# Patient Record
Sex: Female | Born: 1965
Health system: Southern US, Community
[De-identification: ages and names within clinical notes are randomized; demographics above are authoritative.]

## PROBLEM LIST (undated history)

## (undated) DIAGNOSIS — K529 Noninfective gastroenteritis and colitis, unspecified: Secondary | ICD-10-CM

## (undated) DIAGNOSIS — Z8659 Personal history of other mental and behavioral disorders: Secondary | ICD-10-CM

## (undated) DIAGNOSIS — Z1509 Genetic susceptibility to other malignant neoplasm: Secondary | ICD-10-CM

## (undated) DIAGNOSIS — R002 Palpitations: Secondary | ICD-10-CM

## (undated) DIAGNOSIS — Z1507 Genetic susceptibility to malignant neoplasm of urinary tract: Secondary | ICD-10-CM

## (undated) HISTORY — PX: AUGMENTATION MAMMAPLASTY: SUR837

## (undated) HISTORY — DX: Palpitations: R00.2

## (undated) HISTORY — DX: Personal history of other mental and behavioral disorders: Z86.59

## (undated) HISTORY — DX: Genetic susceptibility to other malignant neoplasm: Z15.09

## (undated) HISTORY — DX: Genetic susceptibility to malignant neoplasm of urinary tract: Z15.07

## (undated) HISTORY — DX: Noninfective gastroenteritis and colitis, unspecified: K52.9

## (undated) HISTORY — PX: ANAL SPHINCTER PROSTHESIS PLACEMENT: SUR1036

---

## 1998-06-10 ENCOUNTER — Other Ambulatory Visit: Admission: RE | Admit: 1998-06-10 | Discharge: 1998-06-10 | Payer: Self-pay | Admitting: Obstetrics and Gynecology

## 2004-04-20 ENCOUNTER — Emergency Department (HOSPITAL_COMMUNITY): Admission: EM | Admit: 2004-04-20 | Discharge: 2004-04-20 | Payer: Self-pay | Admitting: Emergency Medicine

## 2004-10-26 ENCOUNTER — Emergency Department (HOSPITAL_COMMUNITY): Admission: EM | Admit: 2004-10-26 | Discharge: 2004-10-26 | Payer: Self-pay | Admitting: Emergency Medicine

## 2005-10-03 ENCOUNTER — Ambulatory Visit: Payer: Self-pay | Admitting: Gastroenterology

## 2006-04-07 ENCOUNTER — Emergency Department (HOSPITAL_COMMUNITY): Admission: EM | Admit: 2006-04-07 | Discharge: 2006-04-07 | Payer: Self-pay | Admitting: Emergency Medicine

## 2006-04-09 ENCOUNTER — Emergency Department (HOSPITAL_COMMUNITY): Admission: EM | Admit: 2006-04-09 | Discharge: 2006-04-09 | Payer: Self-pay | Admitting: Emergency Medicine

## 2006-06-21 ENCOUNTER — Ambulatory Visit: Payer: Self-pay | Admitting: Gastroenterology

## 2006-08-16 ENCOUNTER — Ambulatory Visit: Payer: Self-pay | Admitting: Gastroenterology

## 2006-09-21 ENCOUNTER — Ambulatory Visit: Payer: Self-pay | Admitting: Gastroenterology

## 2013-09-25 ENCOUNTER — Ambulatory Visit (HOSPITAL_COMMUNITY): Payer: Self-pay | Admitting: Psychology

## 2013-09-26 ENCOUNTER — Telehealth (HOSPITAL_COMMUNITY): Payer: Self-pay | Admitting: *Deleted

## 2013-09-26 NOTE — Telephone Encounter (Signed)
Opened in error

## 2013-10-14 ENCOUNTER — Encounter (INDEPENDENT_AMBULATORY_CARE_PROVIDER_SITE_OTHER): Payer: Self-pay

## 2013-10-14 ENCOUNTER — Ambulatory Visit (INDEPENDENT_AMBULATORY_CARE_PROVIDER_SITE_OTHER): Payer: BC Managed Care – PPO | Admitting: Psychiatry

## 2013-10-14 DIAGNOSIS — F411 Generalized anxiety disorder: Secondary | ICD-10-CM

## 2013-10-15 DIAGNOSIS — F411 Generalized anxiety disorder: Secondary | ICD-10-CM | POA: Insufficient documentation

## 2013-10-15 NOTE — Progress Notes (Signed)
Patient ID: Pamela Greene, female   DOB: 02/14/1966, 47 y.o.   MRN: [id number] Presenting Problem Chief Complaint: anxiety  What are the main stressors in your life right now, how long? Mother is alcohol dependent, guilt, history of co-dependence and enabling behavior with mother.  Previous mental health services Have you ever been treated for a mental health problem, when, where, by whom? Yes. Psych medication management through primary care.    Are you currently seeing a therapist or counselor, counselor's name? No   Have you ever had a mental health hospitalization, how many times, length of stay? No   Have you ever been treated with medication, name, reason, response? Yes. Pt. Reports good response with lexapro.   Have you ever had suicidal thoughts or attempted suicide, when, how? No   Risk factors for Suicide Demographic factors:  Caucasian Current mental status: none reported Loss factors: none reported Historical factors: Family history of mental illness or substance abuse Risk Reduction factors: Sense of responsibility to family and Living with another person, especially a relative Clinical factors:  anxiety Cognitive features that contribute to risk:  None observed  SUICIDE RISK:  Minimal: No identifiable suicidal ideation.  Patients presenting with no risk factors but with morbid ruminations; may be classified as minimal risk based on the severity of the depressive symptoms  Medical history   Current medications: 40 mg lexapro Prescribed by: primary care Is there any history of mental health problems or substance abuse in your family, whom? Family history of depression, anxiety, and alcohol dependence. Has anyone in your family been hospitalized, who, where, length of stay? no  Social/family history Have you been married, how many times?  One marriage; divorced  Do you have children?  2 teenage children  Who lives in your current household? Lives with teenage  children.  Military history: No   Religious/spiritual involvement:  What religion/faith base are you? deferred  Family of origin (childhood history)  Where were you born? [place] Where did you grow up? [place]  Describe the atmosphere of the household where you grew up: chaotic; mother alcohol dependent and narcissistic  Do you have siblings, step/half siblings, list names, relation, sex, age? Yes. Younger sister Tresa Endo).  Are your parents separated/divorced, when and why? Yes. Mother is alcohol dependent.   Are your parents alive? Mother is alive  Social supports (personal and professional): boyfriend, son (43), daughter (12)  Education How many grades have you completed? deferred Did you have any problems in school, what type? No  Medications prescribed for these problems? 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 none  Have you ever been exposed to something traumatic, describe? No   Substance use None reported  Mental Status: General Appearance Luretha Murphy:  Casual Eye Contact:  Good Motor Behavior:  Normal Speech:  Normal Level of Consciousness:  Alert Mood:  Euthymic Affect:  Appropriate Anxiety Level: minimal Thought Process:  Coherent Thought Content:  WNL Perception:  Normal Judgment:  Good Insight:  Present Cognition: wnl  Diagnosis AXIS I Anxiety Disorder NOS  AXIS II No diagnosis  AXIS III No past medical history on file.  AXIS IV other psychosocial or environmental problems  AXIS V 51-60 moderate symptoms   Plan: Pt. To make plan for setting more appropriate boundaries with alcohol dependent mother. Pt. To return in 2 weeks for continued assessment.  _________________________________________  Eloise Levels, Ph.D., [place], [place]

## 2013-11-04 ENCOUNTER — Encounter (HOSPITAL_COMMUNITY): Payer: Self-pay | Admitting: Psychiatry

## 2013-11-04 ENCOUNTER — Ambulatory Visit (INDEPENDENT_AMBULATORY_CARE_PROVIDER_SITE_OTHER): Payer: BC Managed Care – PPO | Admitting: Psychiatry

## 2013-11-04 DIAGNOSIS — F411 Generalized anxiety disorder: Secondary | ICD-10-CM

## 2013-11-04 NOTE — Progress Notes (Signed)
   THERAPIST PROGRESS NOTE  Session Time: 12:30-1:20  Participation Level: Active  Behavioral Response: CasualAlertEuthymic  Type of Therapy: Individual Therapy  Treatment Goals addressed: Coping  Interventions: CBT  Summary: Pamela Greene is a 47 y.o. female who presents with anxiety.   Suicidal/Homicidal: Nowithout intent/plan  Therapist Response: Pt. Presents with bright affect, smiles and laughs appropriately. Pt. Reports that she is making progress with setting healthy boundaries for self-care and in relationship with mother. Pt. Reports that she continues to experience significant guilt. Session focused on distinguishing between guilt for self-care and resentment/anger for violating personal boundaries, relational patterns. Provided introduction to breathwork and meditation.  Plan: Pt. Advised to develop 10 minute/day breathing and meditation practice. Return again in 2 weeks.  Diagnosis: Axis I: Anxiety Disorder NOS    Axis II: No diagnosis    Wynonia Musty 11/04/2013

## 2013-11-21 ENCOUNTER — Ambulatory Visit (INDEPENDENT_AMBULATORY_CARE_PROVIDER_SITE_OTHER): Payer: BC Managed Care – PPO | Admitting: Psychiatry

## 2013-11-21 DIAGNOSIS — F411 Generalized anxiety disorder: Secondary | ICD-10-CM

## 2013-11-21 NOTE — Progress Notes (Signed)
Patient ID: Pamela Greene, female   DOB: 01/14/1966, 48 y.o.   MRN: [id number]  Session Time: 12:30-1:20   Participation Level: Active   Behavioral Response: CasualAlertEuthymic   Type of Therapy: Individual Therapy   Treatment Goals addressed: Coping   Interventions: CBT   Summary: Pamela Greene is a 48 y.o. female who presents with anxiety.   Suicidal/Homicidal: Nowithout intent/plan   Therapist Response: Pt. Presents with bright affect, smiles and laughs appropriately. Pt. Reports that she is continuing to make progress with setting healthy boundaries for self-care and in relationship with mother. Pt. Reports that she continues to experience guilt from mother especially as mother is reacting to new boundaries. Session processing resentment in the mother/daughter relationship, relationship challenges with partner, relationships with children.   Plan: Pt. Advised to continue to focus on breathwork and developing meditation practice. Return again in 2 weeks.  Diagnosis: Axis I: Anxiety Disorder NOS  Axis II: No diagnosis  Wynonia MustyBrown, Remonia Otte B, COUNS  11/21/2013

## 2013-12-10 ENCOUNTER — Ambulatory Visit (HOSPITAL_COMMUNITY): Payer: Self-pay | Admitting: Psychiatry

## 2014-01-20 ENCOUNTER — Ambulatory Visit (HOSPITAL_COMMUNITY): Payer: Self-pay | Admitting: Psychiatry

## 2014-02-06 ENCOUNTER — Ambulatory Visit (INDEPENDENT_AMBULATORY_CARE_PROVIDER_SITE_OTHER): Payer: BC Managed Care – PPO | Admitting: Psychiatry

## 2014-02-06 DIAGNOSIS — F411 Generalized anxiety disorder: Secondary | ICD-10-CM

## 2014-02-06 NOTE — Progress Notes (Signed)
   THERAPIST PROGRESS NOTE  Session Time: 1:00-1:50  Participation Level: Active   Behavioral Response: CasualAlertEuthymic   Type of Therapy: Individual Therapy   Treatment Goals addressed: Coping   Interventions: CBT   Summary: Joaquin CourtsKristen E Short is a 48 y.o. female who presents with anxiety.   Suicidal/Homicidal: Nowithout intent/plan   Therapist Response: Pt. Presents with bright affect, smiles and laughs appropriately. Pt. Reports that she continues to move forward in romantic relationship with partner who is supportive and affirming of her. Pt. Reports that her mother continues to be a source of conflict in her family due to her continued drinking and emotional abuse. Pt. Reports that she has continued making progress in setting firm boundaries with her mother, pointing out inappropriate behavior especially as it has recently affected Pt.'s daughter. Pt. Is coping with discomfort of setting new boundaries by creating new rituals for herself and her fiance.  Plan: Pt. Advised to continue to focus on breathwork and developing meditation practice. Return again in 4 weeks.   Diagnosis: Axis I: Anxiety Disorder NOS   Axis II: No diagnosis   Wynonia MustyBrown, Jennifer B, COUNS 02/06/2014

## 2014-03-07 ENCOUNTER — Encounter (HOSPITAL_COMMUNITY): Payer: Self-pay | Admitting: Emergency Medicine

## 2014-03-07 ENCOUNTER — Emergency Department (HOSPITAL_COMMUNITY)
Admission: EM | Admit: 2014-03-07 | Discharge: 2014-03-07 | Disposition: A | Discharge status: Home / Self Care | Payer: BC Managed Care – PPO | Source: Home / Self Care | Attending: Family Medicine | Admitting: Family Medicine

## 2014-03-07 DIAGNOSIS — J020 Streptococcal pharyngitis: Secondary | ICD-10-CM

## 2014-03-07 LAB — POCT RAPID STREP A: Streptococcus, Group A Screen (Direct): NEGATIVE

## 2014-03-07 MED ORDER — AMOXICILLIN 500 MG PO CAPS: 500.0000 mg | ORAL_CAPSULE | Freq: Three times a day (TID) | ORAL | Status: DC

## 2014-03-07 NOTE — Discharge Instructions (Signed)
Drink lots of fluids, take all of medicine, use lozenges as needed.return if needed °

## 2014-03-07 NOTE — ED Provider Notes (Signed)
CSN: [id number]     Arrival date & time 03/07/14  0944 History   First MD Initiated Contact with Patient 03/07/14 [phone]     Chief Complaint  Patient presents with  . Sore Throat   (Consider location/radiation/quality/duration/timing/severity/associated sxs/prior Treatment) Patient is a 48 y.o. female presenting with pharyngitis.  Sore Throat This is a new problem. The current episode started yesterday. The problem has been gradually worsening. Pertinent negatives include no chest pain and no abdominal pain. The symptoms are aggravated by swallowing.    History reviewed. No pertinent past medical history. Past Surgical History  Procedure Laterality Date  . Anal sphincter prosthesis placement     History reviewed. No pertinent family history. History  Substance Use Topics  . Smoking status: Never Smoker   . Smokeless tobacco: Not on file  . Alcohol Use: Yes   OB History   Grav Para Term Preterm Abortions TAB SAB Ect Mult Living                 Review of Systems  Constitutional: Positive for fever and chills.  HENT: Positive for sore throat. Negative for trouble swallowing.   Respiratory: Negative for cough.   Cardiovascular: Negative for chest pain.  Gastrointestinal: Negative.  Negative for abdominal pain.  Musculoskeletal: Positive for myalgias.  Skin: Negative.     Allergies  Review of patient's allergies indicates no known allergies.  Home Medications   Prior to Admission medications   Not on File   BP 115/76  Pulse 96  Temp(Src) 100.4 F (38 C) (Oral)  Resp 18  SpO2 99% Physical Exam  Nursing note and vitals reviewed. Constitutional: She is oriented to person, place, and time. She appears well-developed and well-nourished. She appears distressed.  HENT:  Right Ear: External ear normal.  Left Ear: External ear normal.  Mouth/Throat: Uvula is midline and mucous membranes are normal. Oropharyngeal exudate and posterior oropharyngeal erythema present.  Eyes:  Pupils are equal, round, and reactive to light.  Neck: Normal range of motion. Neck supple.  Pulmonary/Chest: Effort normal and breath sounds normal.  Lymphadenopathy:    She has cervical adenopathy.  Neurological: She is alert and oriented to person, place, and time.  Skin: Skin is warm and dry.    ED Course  Procedures (including critical care time) Labs Review Labs Reviewed - No data to display  Imaging Review No results found.   MDM   1. Streptococcal sore throat        Linna HoffJames D Kindl, MD 03/07/14 1048

## 2014-03-07 NOTE — ED Notes (Signed)
C/o sore throat x couple of days, ear pain

## 2014-03-10 ENCOUNTER — Telehealth (HOSPITAL_COMMUNITY): Payer: Self-pay | Admitting: *Deleted

## 2014-03-10 LAB — CULTURE, GROUP A STREP

## 2014-03-10 NOTE — ED Notes (Signed)
Throat culture: Strep beta hemolytic not group A. I called pt. Pt. verified x 2 and given result. Pt. told she is adequately treated with Amoxicillin. If anyone she exposed gets the same symptoms should get checked for strep as well.  If not better after the medication to get rechecked. Pt. voiced understanding. Desiree LucySuzanne M Jurni Cesaro 03/10/2014

## 2014-03-27 ENCOUNTER — Ambulatory Visit (HOSPITAL_COMMUNITY): Payer: Self-pay | Admitting: Psychiatry

## 2014-04-23 ENCOUNTER — Ambulatory Visit (INDEPENDENT_AMBULATORY_CARE_PROVIDER_SITE_OTHER): Payer: BC Managed Care – PPO | Admitting: Psychiatry

## 2014-04-23 DIAGNOSIS — F411 Generalized anxiety disorder: Secondary | ICD-10-CM

## 2014-04-23 NOTE — Progress Notes (Signed)
   THERAPIST PROGRESS NOTE  Session Time: 1:00-1:50   Participation Level: Active   Behavioral Response: CasualAlertEuthymic   Type of Therapy: Individual Therapy   Treatment Goals addressed: Coping   Interventions: CBT   Summary: Pamela CourtsKristen E Greene is a 48 y.o. female who presents with anxiety.   Suicidal/Homicidal: Nowithout intent/plan   Therapist Response: Pt. Presents with bright affect, smiles and laughs appropriately. Pt. Reports that she has made significant progress in her personal life. She completed move with her boyfriend and they are adjusting well to living together. Pt. Reports that her children are healthy and she is happy with their decisions and developing responsibility for their life choices. Pt. Reports that she continues to work on co-dependent relationship with her mother and that her life is better since she let go of obligation of Sunday dinner at her mother's house. Pt. Continues to struggle with guilt about calling her mother daily and feeling manipulated. Pt. Was encouraged to empower herself by initiating contact with her mother at times and places that work for her so that she does not feel as powerless in the relationship and makes active steps towards redefining her relationship with her mother.   Plan: Pt. Advised to continue to focus on breathwork and developing meditation practice. Return again in 4 weeks.   Diagnosis: Axis I: Anxiety Disorder NOS   Axis II: No diagnosis     Wynonia MustyBrown, Jennifer B, COUNS 04/23/2014

## 2014-07-18 ENCOUNTER — Ambulatory Visit (HOSPITAL_COMMUNITY): Payer: Self-pay | Admitting: Psychiatry

## 2014-07-21 ENCOUNTER — Ambulatory Visit (INDEPENDENT_AMBULATORY_CARE_PROVIDER_SITE_OTHER): Payer: BC Managed Care – PPO | Admitting: Psychiatry

## 2014-07-21 DIAGNOSIS — F411 Generalized anxiety disorder: Secondary | ICD-10-CM

## 2014-07-22 NOTE — Progress Notes (Signed)
   THERAPIST PROGRESS NOTE Session Time: 3:00-4:00  Participation Level: Active   Behavioral Response: CasualAlertEuthymic   Type of Therapy: Individual Therapy   Treatment Goals addressed: Coping   Interventions: CBT, strength-based  Summary: Pamela Greene is a 48 y.o. female who presents with anxiety.   Suicidal/Homicidal: Nowithout intent/plan   Therapist Response: Pt. Presents with bright affect, smiles and laughs appropriately. Pt. Reports that she has been challenged by insomnia and anxiety over last month. Pt. Reports addition of significant stressors including her partner's injury, employer sold, and daughter's illness. Pt. Reports that she feels anxious at night and has difficulty going to sleep. Significant part of session reviewing and normalizing stressors and symptoms, reviewed sleep hygiene practices, encouraged reduction of highly stimulating activities, inclusion of evening bath, and limiting screentime at least one hour before bedtime. Pt. Also discussed changing pattern of enabling irresponsible behavior of son in college. Reviewed progress in relationship with her mother and how to apply same behaviors in relationship with son.  Plan: Pt. Advised to work on sleep hygiene and yoga grounding practice and progressive muscle relaxation. Return again in 4 weeks.   Diagnosis: Axis I: Anxiety Disorder NOS   Axis II: No diagnosis      Pamela Greene 07/22/2014

## 2014-08-08 ENCOUNTER — Ambulatory Visit (HOSPITAL_COMMUNITY): Payer: Self-pay | Admitting: Psychiatry

## 2014-09-12 ENCOUNTER — Ambulatory Visit (HOSPITAL_COMMUNITY): Payer: Self-pay | Admitting: Psychiatry

## 2014-10-10 ENCOUNTER — Ambulatory Visit (INDEPENDENT_AMBULATORY_CARE_PROVIDER_SITE_OTHER): Payer: BC Managed Care – PPO | Admitting: Psychiatry

## 2014-10-10 DIAGNOSIS — F411 Generalized anxiety disorder: Secondary | ICD-10-CM

## 2014-10-13 NOTE — Progress Notes (Signed)
   THERAPIST PROGRESS NOTE  Session Time: 1:00-2:00  Participation Level: Active   Behavioral Response: CasualAlertEuthymic  Type of Therapy: Individual Therapy   Treatment Goals addressed: Coping   Interventions: CBT, strength-based  Summary: Pamela Greene is a 48 y.o. female who presents with anxiety.  Suicidal/Homicidal: Nowithout intent/plan   Therapist Response: Pt. Presents with bright affect, smiles and laughs appropriately. Pt. Reports minimal anxiety and mood has been consistently good since our last session. Pt. Reports that she continues to make progress with setting healthy boundaries with her mother. Pt. Reports that mother's drinking has made holidays in the past painful, but this Thanksgiving was very good because she and her sister set guidelines for the dinner and helped with brining the side dishes which reduced her mother's stress and she was able to go home at a reasonable time. Pt. Reports that she is actively organizing her home with her boyfriend and planning for hosting Christmas in her home. Pt. Reports that her children are happy in college and in last year of high school and anticipating grief of being empty nest. Pt. Reports that she is doing well at work and benefitting from healthy eating which she began to support boyfriend in weight loss plan. Pt. Reports that [facility] and is sleeping well and no drowsiness in the morning.   Plan: Pt. Advised to work on sleep hygiene and yoga grounding practice and progressive muscle relaxation. Return again in 4 weeks.   Diagnosis: Axis I: Anxiety Disorder NOS   Axis II: No diagnosis    Shaune PollackBrown, Jennifer B, [facility] 10/13/2014

## 2014-11-10 ENCOUNTER — Emergency Department (HOSPITAL_COMMUNITY)
Admission: EM | Admit: 2014-11-10 | Discharge: 2014-11-10 | Disposition: A | Discharge status: Home / Self Care | Payer: Managed Care, Other (non HMO) | Attending: Emergency Medicine | Admitting: Emergency Medicine

## 2014-11-10 ENCOUNTER — Encounter (HOSPITAL_COMMUNITY): Payer: Self-pay | Admitting: Emergency Medicine

## 2014-11-10 DIAGNOSIS — Y998 Other external cause status: Secondary | ICD-10-CM | POA: Insufficient documentation

## 2014-11-10 DIAGNOSIS — F0781 Postconcussional syndrome: Secondary | ICD-10-CM

## 2014-11-10 DIAGNOSIS — Z792 Long term (current) use of antibiotics: Secondary | ICD-10-CM | POA: Insufficient documentation

## 2014-11-10 DIAGNOSIS — W228XXA Striking against or struck by other objects, initial encounter: Secondary | ICD-10-CM | POA: Diagnosis not present

## 2014-11-10 DIAGNOSIS — Z79899 Other long term (current) drug therapy: Secondary | ICD-10-CM | POA: Insufficient documentation

## 2014-11-10 DIAGNOSIS — S0990XA Unspecified injury of head, initial encounter: Secondary | ICD-10-CM | POA: Insufficient documentation

## 2014-11-10 DIAGNOSIS — Y9289 Other specified places as the place of occurrence of the external cause: Secondary | ICD-10-CM | POA: Insufficient documentation

## 2014-11-10 DIAGNOSIS — Y9389 Activity, other specified: Secondary | ICD-10-CM | POA: Diagnosis not present

## 2014-11-10 MED ORDER — ONDANSETRON 4 MG PO TBDP: 4.0000 mg | ORAL_TABLET | Freq: Three times a day (TID) | ORAL | Status: DC | PRN

## 2014-11-10 NOTE — ED Notes (Signed)
Per pt, states she was getting something out from under couch and when she raised up she hitback, top of head on marble table-no LOC-states she has'nt felt well since-PCP told her to come to ED for eval

## 2014-11-10 NOTE — Discharge Instructions (Signed)
Post-Concussion Syndrome Post-concussion syndrome describes the symptoms that can occur after a head injury. These symptoms can last from weeks to months. CAUSES  It is not clear why some head injuries cause post-concussion syndrome. It can occur whether your head injury was mild or severe and whether you were wearing head protection or not.  SIGNS AND SYMPTOMS  Memory difficulties.  Dizziness.  Headaches.  Double vision or blurry vision.  Sensitivity to light.  Hearing difficulties.  Depression.  Tiredness.  Weakness.  Difficulty with concentration.  Difficulty sleeping or staying asleep.  Vomiting.  Poor balance or instability on your feet.  Slow reaction time.  Difficulty learning and remembering things you have heard. DIAGNOSIS  There is no test to determine whether you have post-concussion syndrome. Your health care provider may order an imaging scan of your brain, such as a CT scan, to check for other problems that may be causing your symptoms (such as severe injury inside your skull). TREATMENT  Usually, these problems disappear over time without medical care. Your health care provider may prescribe medicine to help ease your symptoms. It is important to follow up with a neurologist to evaluate your recovery and address any lingering symptoms or issues. HOME CARE INSTRUCTIONS   Only take over-the-counter or prescription medicines for pain, discomfort, or fever as directed by your health care provider. Do not take aspirin. Aspirin can slow blood clotting.  Sleep with your head slightly elevated to help with headaches.  Avoid any situation where there is potential for another head injury (football, hockey, soccer, basketball, martial arts, downhill snow sports, and horseback riding). Your condition will get worse every time you experience a concussion. You should avoid these activities until you are evaluated by the appropriate follow-up health care  providers.  Keep all follow-up appointments as directed by your health care provider. SEEK IMMEDIATE MEDICAL CARE IF:  You develop confusion or unusual drowsiness.  You cannot wake the injured person.  You develop nausea or persistent, forceful vomiting.  You feel like you are moving when you are not (vertigo).  You notice the injured person's eyes moving rapidly back and forth. This may be a sign of vertigo.  You have convulsions or faint.  You have severe, persistent headaches that are not relieved by medicine.  You cannot use your arms or legs normally.  Your pupils change size.  You have clear or bloody discharge from the nose or ears.  Your problems are getting worse, not better. MAKE SURE YOU:  Understand these instructions.  Will watch your condition.  Will get help right away if you are not doing well or get worse. Document Released: 04/15/2002 Document Revised: 08/14/2013 Document Reviewed: 01/29/2014 Exit[employer] Patient Information 2015 [employer]. This information is not intended to replace advice given to you by your health care provider. Make sure you discuss any questions you have with your health care provider.  

## 2014-11-15 NOTE — ED Provider Notes (Signed)
CSN: [id number]     Arrival date & time 11/10/14  1231 History   First MD Initiated Contact with Patient 11/10/14 1301     Chief Complaint  Patient presents with  . Headache     (Consider location/radiation/quality/duration/timing/severity/associated sxs/prior Treatment) HPI   49 rolled female presenting after head injury couple days ago. She bent over to pick something off the floor and she started she struck the back of her head against a marble tabletop. There is no loss of consciousness. Since then she has had a persistent headache. Intermittent nausea. Feels fatigued. No acute visual complaints. No blood thinners. No difficulty with ambulation. No acute numbness, tingling or focal loss of strength.   History reviewed. No pertinent past medical history. Past Surgical History  Procedure Laterality Date  . Anal sphincter prosthesis placement     No family history on file. History  Substance Use Topics  . Smoking status: Never Smoker   . Smokeless tobacco: Not on file  . Alcohol Use: Yes   OB History    No data available     Review of Systems  All systems reviewed and negative, other than as noted in HPI.   Allergies  Review of patient's allergies indicates no known allergies.  Home Medications   Prior to Admission medications   Medication Sig Start Date End Date Taking? Authorizing Provider  b complex vitamins tablet Take 1 tablet by mouth daily.   Yes Historical Provider, MD  calcium-vitamin D (OSCAL WITH D) 500-200 MG-UNIT per tablet Take 1 tablet by mouth daily with breakfast.   Yes Historical Provider, MD  escitalopram (LEXAPRO) 20 MG tablet Take 20 mg by mouth daily.    Yes Historical Provider, MD  Multiple Vitamin (MULTIVITAMIN WITH MINERALS) TABS tablet Take 1 tablet by mouth daily.   Yes Historical Provider, MD  amoxicillin (AMOXIL) 500 MG capsule Take 1 capsule (500 mg total) by mouth 3 (three) times daily. Patient not taking: Reported on 11/10/2014 03/07/14    Linna Hoff, MD  ondansetron (ZOFRAN ODT) 4 MG disintegrating tablet Take 1 tablet (4 mg total) by mouth every 8 (eight) hours as needed for nausea or vomiting. 11/10/14   Raeford Razor, MD   BP 125/67 mmHg  Pulse 85  Temp(Src) 98.1 F (36.7 C) (Oral)  Resp 17  SpO2 97%  LMP 11/07/2014 Physical Exam  Constitutional: She is oriented to person, place, and time. She appears well-developed and well-nourished. No distress.  HENT:  Head: Normocephalic and atraumatic.  No scalp tenderness or hematoma appreciated.  Eyes: Conjunctivae and EOM are normal. Pupils are equal, round, and reactive to light. Right eye exhibits no discharge. Left eye exhibits no discharge.  Neck: Neck supple.  Cardiovascular: Normal rate, regular rhythm and normal heart sounds.  Exam reveals no gallop and no friction rub.   No murmur heard. Pulmonary/Chest: Effort normal and breath sounds normal. No respiratory distress.  Abdominal: Soft. She exhibits no distension. There is no tenderness.  Musculoskeletal: She exhibits no edema or tenderness.  No midline spinal tenderness  Neurological: She is alert and oriented to person, place, and time. No cranial nerve deficit. She exhibits normal muscle tone. Coordination normal.  Speech clear. Content appropriate. Follows commands. Good finger to nose testing bilaterally. Gait is steady.  Skin: Skin is warm and dry.  Psychiatric: She has a normal mood and affect. Her behavior is normal. Thought content normal.  Nursing note and vitals reviewed.   ED Course  Procedures (including critical care  time) Labs Review Labs Reviewed - No data to display  Imaging Review No results found.   EKG Interpretation None      MDM   Final diagnoses:  Post concussive syndrome    49 year old female with likely postconcussive syndrome. Exam is nonfocal. Low suspicion for fracture or bleed. Discussed obtaining neuroimaging although I do not feel it is necessary. Patient is fine with  foregoing and at this time. Discussed typical signs and symptoms of postconcussive syndrome. Emergent return precautions were discussed.   Raeford RazorStephen Remmy Riffe, MD 11/15/14 [phone]

## 2015-01-13 ENCOUNTER — Ambulatory Visit (HOSPITAL_COMMUNITY): Payer: Self-pay | Admitting: Psychiatry

## 2015-02-03 ENCOUNTER — Ambulatory Visit (INDEPENDENT_AMBULATORY_CARE_PROVIDER_SITE_OTHER): Payer: Managed Care, Other (non HMO) | Admitting: Psychiatry

## 2015-02-03 DIAGNOSIS — F411 Generalized anxiety disorder: Secondary | ICD-10-CM

## 2015-02-04 NOTE — Progress Notes (Signed)
   THERAPIST PROGRESS NOTE   Session Time: 1:00-2:00  Participation Level: Active   Behavioral Response: CasualAlertEuthymic  Type of Therapy: Individual Therapy   Treatment Goals addressed: Coping   Interventions: CBT, strength-based  Summary: Joaquin CourtsKristen E Greene is a 49 y.o. female who presents with anxiety.  Suicidal/Homicidal: Nowithout intent/plan   Therapist Response: Pt. Continues to present with bright affect, smiles and laughs appropriately. Pt. Reports that she continues to set healthy boundaries with her alcohol dependent mother and with her teenage children. Pt. Reports that relationship with significant other is healthy and considering getting married. Pt. Reports that recent stressor occurred during Easter weekend with her daughter and niece. Significant part of session spent discussing Pt.'s need to exercise control in relationships and fears of relationship loss.  Plan: Pt. To continue with strength based approach and CBT. Return again in 4 weeks.   Diagnosis: Axis I: Anxiety Disorder NOS   Axis II: No diagnosis   Shaune PollackBrown, Jennifer B, [facility] 02/04/2015

## 2015-02-06 ENCOUNTER — Ambulatory Visit (HOSPITAL_COMMUNITY): Payer: Self-pay | Admitting: Psychiatry

## 2015-03-13 ENCOUNTER — Ambulatory Visit (HOSPITAL_COMMUNITY): Payer: Self-pay | Admitting: Psychiatry

## 2015-04-10 ENCOUNTER — Ambulatory Visit (HOSPITAL_COMMUNITY): Payer: Self-pay | Admitting: Psychiatry

## 2015-05-08 ENCOUNTER — Ambulatory Visit (HOSPITAL_COMMUNITY): Payer: Self-pay | Admitting: Psychiatry

## 2015-07-07 ENCOUNTER — Ambulatory Visit (HOSPITAL_COMMUNITY): Payer: Self-pay | Admitting: Psychiatry

## 2015-07-10 ENCOUNTER — Ambulatory Visit (HOSPITAL_COMMUNITY): Payer: Self-pay | Admitting: Psychiatry

## 2015-10-14 ENCOUNTER — Encounter: Payer: Self-pay | Admitting: Gastroenterology

## 2016-09-09 ENCOUNTER — Other Ambulatory Visit: Payer: Self-pay | Admitting: Obstetrics and Gynecology

## 2016-09-09 DIAGNOSIS — R928 Other abnormal and inconclusive findings on diagnostic imaging of breast: Secondary | ICD-10-CM

## 2016-09-13 ENCOUNTER — Ambulatory Visit
Admission: RE | Admit: 2016-09-13 | Discharge: 2016-09-13 | Disposition: A | Discharge status: Home / Self Care | Payer: 59 | Source: Ambulatory Visit | Attending: Obstetrics and Gynecology | Admitting: Obstetrics and Gynecology

## 2016-09-13 DIAGNOSIS — R928 Other abnormal and inconclusive findings on diagnostic imaging of breast: Secondary | ICD-10-CM

## 2017-06-24 ENCOUNTER — Encounter (HOSPITAL_COMMUNITY): Payer: Self-pay | Admitting: *Deleted

## 2017-06-24 ENCOUNTER — Ambulatory Visit (INDEPENDENT_AMBULATORY_CARE_PROVIDER_SITE_OTHER): Payer: BLUE CROSS/BLUE SHIELD

## 2017-06-24 ENCOUNTER — Ambulatory Visit (HOSPITAL_COMMUNITY)
Admission: EM | Admit: 2017-06-24 | Discharge: 2017-06-24 | Disposition: A | Discharge status: Home / Self Care | Payer: BLUE CROSS/BLUE SHIELD | Attending: Family | Admitting: Family

## 2017-06-24 DIAGNOSIS — S63501A Unspecified sprain of right wrist, initial encounter: Secondary | ICD-10-CM

## 2017-06-24 DIAGNOSIS — S01511A Laceration without foreign body of lip, initial encounter: Secondary | ICD-10-CM

## 2017-06-24 NOTE — ED Provider Notes (Signed)
[facility]    CSN: [id number] Arrival date & time: 06/24/17  1551     History   Chief Complaint Chief Complaint  Patient presents with  . Fall  . Lip Laceration  . Wrist Pain    HPI Pamela Greene is a 51 y.o. female.   Chief complaint of right wrist pain, and small laceration to lip after a fall this afternoon, unchanged. Mouth 'took brunt of hit.'  Unsure how landed on wrist. Doesn't think wrist was extended. Right top of hand is swollen. No eccymosis. Top teeth feel 'sore', no teeth appear loose or discolored. No dental implants.   Packing to move and [place] over [employer].   No HA, vision changes, confusion, weakness,  numbness. No loc.   No h/o osteoporosis.             History reviewed. No pertinent past medical history.  Patient Active Problem List   Diagnosis Date Noted  . Anxiety state, unspecified 10/15/2013    Past Surgical History:  Procedure Laterality Date  . ANAL SPHINCTER PROSTHESIS PLACEMENT      OB History    No data available       Home Medications    Prior to Admission medications   Medication Sig Start Date End Date Taking? Authorizing Provider  calcium-vitamin D (OSCAL WITH D) 500-200 MG-UNIT per tablet Take 1 tablet by mouth daily with breakfast.   Yes [provider]  escitalopram (LEXAPRO) 20 MG tablet Take 20 mg by mouth daily.    Yes [provider]  Multiple Vitamin (MULTIVITAMIN WITH MINERALS) TABS tablet Take 1 tablet by mouth daily.   Yes [provider]  amoxicillin (AMOXIL) 500 MG capsule Take 1 capsule (500 mg total) by mouth 3 (three) times daily. Patient not taking: Reported on 11/10/2014 03/07/14   Linna Hoff, MD  b complex vitamins tablet Take 1 tablet by mouth daily.    [provider]  ondansetron (ZOFRAN ODT) 4 MG disintegrating tablet Take 1 tablet (4 mg total) by mouth every 8 (eight) hours as needed for nausea or vomiting. 11/10/14   Raeford Razor, MD     Family History History reviewed. No pertinent family history.  Social History Social History  Substance Use Topics  . Smoking status: Never Smoker  . Smokeless tobacco: Never Used  . Alcohol use Yes     Allergies   Patient has no known allergies.   Review of Systems Review of Systems  Constitutional: Negative for chills and fever.  Respiratory: Negative for cough.   Cardiovascular: Negative for chest pain and palpitations.  Gastrointestinal: Negative for nausea and vomiting.  Musculoskeletal: Positive for joint swelling. Negative for neck pain and neck stiffness.  Neurological: Negative for numbness and headaches.  Psychiatric/Behavioral: Negative for confusion.     Physical Exam Triage Vital Signs ED Triage Vitals  Enc Vitals Group     BP 06/24/17 1643 121/78     Pulse Rate 06/24/17 1643 78     Resp 06/24/17 1643 17     Temp 06/24/17 1643 98.7 F (37.1 C)     Temp Source 06/24/17 1643 Oral     SpO2 06/24/17 1643 100 %     Weight --      Height --      Head Circumference --      Peak Flow --      Pain Score 06/24/17 1644 6     Pain Loc --  Pain Edu? --      Excl. in GC? --    No data found.   Updated Vital Signs BP 121/78 (BP Location: Left Arm)   Pulse 78   Temp 98.7 F (37.1 C) (Oral)   Resp 17   SpO2 100%   Visual Acuity Right Eye Distance:   Left Eye Distance:   Bilateral Distance:    Right Eye Near:   Left Eye Near:    Bilateral Near:     Physical Exam  Constitutional: She appears well-developed and well-nourished.  HENT:  Head:    Mouth/Throat: Uvula is midline, oropharynx is clear and moist and mucous membranes are normal. Normal dentition. Lacerations present.  Erythema noted over chin. Lower labile swollen.  No bony tenderness of mandible. Teeth are not loose. Tooth color symmetric and similar to all teeth 1cm laceration noted oral mucosa in a shape similar to upper teeth. Wound edges well approximated. No active bleeding.    Eyes: Pupils are equal, round, and reactive to light. Conjunctivae and EOM are normal.  Fundus normal bilaterally.   Cardiovascular: Normal rate, regular rhythm, normal heart sounds and normal pulses.   Pulmonary/Chest: Effort normal and breath sounds normal. She has no wheezes. She has no rhonchi. She has no rales.  Musculoskeletal:       Right wrist: She exhibits decreased range of motion and tenderness. She exhibits no bony tenderness, no swelling, no effusion, no deformity and no laceration.  Grip strength normal. No swelling noted over hand or wrist.  Palpable radial pulses and sensation intact.  Able to squeeze hand and make okay sign. No tenderness of CMC or snuffbox tenderness noted.  No tenderness or bony step off along ulnar or radial border of wrist.   Slight pain with resisted wrist dorsiflexion.   Neurological: She is alert. She has normal strength. No cranial nerve deficit or sensory deficit. She displays a negative Romberg sign.  Reflex Scores:      Bicep reflexes are 2+ on the right side and 2+ on the left side.      Patellar reflexes are 2+ on the right side and 2+ on the left side. Grip equal and strong bilateral upper extremities. Gait strong and steady. Able to perform rapid alternating movement without difficulty.   Skin: Skin is warm and dry.  Psychiatric: She has a normal mood and affect. Her speech is normal and behavior is normal. Thought content normal.  Vitals reviewed.    UC Treatments / Results  Labs (all labs ordered are listed, but only abnormal results are displayed) Labs Reviewed - No data to display  EKG  EKG Interpretation None       Radiology Dg Hand Complete Right  Result Date: 06/24/2017 CLINICAL DATA:  Recent fall with hand pain, initial encounter EXAM: RIGHT HAND - COMPLETE 3+ VIEW COMPARISON:  None. FINDINGS: There is no evidence of fracture or dislocation. There is no evidence of arthropathy or other focal bone abnormality. Soft  tissues are unremarkable. IMPRESSION: No acute abnormality noted. Electronically Signed   By: Alcide Clever M.D.   On: 06/24/2017 17:33    Procedures Procedures (including critical care time)  Medications Ordered in UC Medications - No data to display   Initial Impression / Assessment and Plan / UC Course  I have reviewed the triage vital signs and the nursing notes.  Pertinent labs & imaging results that were available during my care of the patient were reviewed by me and considered in my  medical decision making (see chart for details).       Final Clinical Impressions(s) / UC Diagnoses   Final diagnoses:  Sprain of right wrist, initial encounter  Lip laceration, initial encounter   Patient well appearing. Normal neurologic exam.   Laceration of lip in the internal mucosa is well approximated. Teeth appeared well intact, not discolored. Advised patient that laceration would heal quickly on its own; continue ice.   Reassured by hand xray. No fracture seen of distal radius, ulna. NO snuff box tenderness. Advised suspect sprain and to continue ice, wrest, compression.   Return precautions given.   New Prescriptions New Prescriptions   No medications on file     Controlled Substance Prescriptions [place] Controlled Substance Registry consulted? Not Applicable   Allegra Grana, FNP 06/24/17 1820

## 2017-06-24 NOTE — ED Triage Notes (Signed)
Patient reports tripping this afternoon and hitting lip and right wrist. Patient with swelling to lower lip with small laceration. Patient reports right wrist pain, no deformity noted or bruising.

## 2017-06-24 NOTE — Discharge Instructions (Signed)
As discussed, pleased to see no fracture was seen on your x-ray. may keep an Ace wrap on it temporarily if this is more comfortable. Ice, gentle exercises. If pain, swelling worsens, please come back for reevaluation.  Please stay very vigilant in terms of your teeth as we discussed monitor for any pain, swelling of gums, discoloration of teeth. Laceration of lip is small enough should heal on its own. Keep applying ice.  If there is no improvement in your symptoms, or if there is any worsening of symptoms, or if you have any additional concerns, please return for re-evaluation; or, if we are closed, consider going to the Emergency Room for evaluation if symptoms urgent.

## 2017-11-07 HISTORY — PX: ABDOMINAL HYSTERECTOMY: SHX81

## 2018-10-11 DIAGNOSIS — F419 Anxiety disorder, unspecified: Secondary | ICD-10-CM | POA: Diagnosis not present

## 2018-10-11 DIAGNOSIS — F5101 Primary insomnia: Secondary | ICD-10-CM | POA: Diagnosis not present

## 2018-12-03 DIAGNOSIS — J0140 Acute pansinusitis, unspecified: Secondary | ICD-10-CM | POA: Diagnosis not present

## 2018-12-03 DIAGNOSIS — J029 Acute pharyngitis, unspecified: Secondary | ICD-10-CM | POA: Diagnosis not present

## 2018-12-03 DIAGNOSIS — R5383 Other fatigue: Secondary | ICD-10-CM | POA: Diagnosis not present

## 2018-12-03 DIAGNOSIS — R05 Cough: Secondary | ICD-10-CM | POA: Diagnosis not present

## 2019-01-03 DIAGNOSIS — R748 Abnormal levels of other serum enzymes: Secondary | ICD-10-CM | POA: Diagnosis not present

## 2019-01-03 DIAGNOSIS — Z23 Encounter for immunization: Secondary | ICD-10-CM | POA: Diagnosis not present

## 2019-01-03 DIAGNOSIS — E559 Vitamin D deficiency, unspecified: Secondary | ICD-10-CM | POA: Diagnosis not present

## 2019-01-03 DIAGNOSIS — Z0000 Encounter for general adult medical examination without abnormal findings: Secondary | ICD-10-CM | POA: Diagnosis not present

## 2019-02-28 DIAGNOSIS — Z1504 Genetic susceptibility to malignant neoplasm of endometrium: Secondary | ICD-10-CM | POA: Diagnosis not present

## 2019-03-01 DIAGNOSIS — H40013 Open angle with borderline findings, low risk, bilateral: Secondary | ICD-10-CM | POA: Diagnosis not present

## 2019-04-05 DIAGNOSIS — F419 Anxiety disorder, unspecified: Secondary | ICD-10-CM | POA: Diagnosis not present

## 2019-04-05 DIAGNOSIS — F5101 Primary insomnia: Secondary | ICD-10-CM | POA: Diagnosis not present

## 2019-05-15 DIAGNOSIS — F4323 Adjustment disorder with mixed anxiety and depressed mood: Secondary | ICD-10-CM | POA: Diagnosis not present

## 2019-05-22 DIAGNOSIS — F4323 Adjustment disorder with mixed anxiety and depressed mood: Secondary | ICD-10-CM | POA: Diagnosis not present

## 2019-06-05 DIAGNOSIS — F4323 Adjustment disorder with mixed anxiety and depressed mood: Secondary | ICD-10-CM | POA: Diagnosis not present

## 2019-06-12 DIAGNOSIS — F4323 Adjustment disorder with mixed anxiety and depressed mood: Secondary | ICD-10-CM | POA: Diagnosis not present

## 2019-06-19 DIAGNOSIS — N959 Unspecified menopausal and perimenopausal disorder: Secondary | ICD-10-CM | POA: Diagnosis not present

## 2019-06-19 DIAGNOSIS — Z1509 Genetic susceptibility to other malignant neoplasm: Secondary | ICD-10-CM | POA: Diagnosis not present

## 2019-06-28 DIAGNOSIS — J0110 Acute frontal sinusitis, unspecified: Secondary | ICD-10-CM | POA: Diagnosis not present

## 2019-07-01 DIAGNOSIS — L918 Other hypertrophic disorders of the skin: Secondary | ICD-10-CM | POA: Diagnosis not present

## 2019-07-01 DIAGNOSIS — L821 Other seborrheic keratosis: Secondary | ICD-10-CM | POA: Diagnosis not present

## 2019-07-01 DIAGNOSIS — L281 Prurigo nodularis: Secondary | ICD-10-CM | POA: Diagnosis not present

## 2019-07-01 DIAGNOSIS — D235 Other benign neoplasm of skin of trunk: Secondary | ICD-10-CM | POA: Diagnosis not present

## 2019-07-03 DIAGNOSIS — F4323 Adjustment disorder with mixed anxiety and depressed mood: Secondary | ICD-10-CM | POA: Diagnosis not present

## 2019-07-17 DIAGNOSIS — F4323 Adjustment disorder with mixed anxiety and depressed mood: Secondary | ICD-10-CM | POA: Diagnosis not present

## 2019-07-31 DIAGNOSIS — F4323 Adjustment disorder with mixed anxiety and depressed mood: Secondary | ICD-10-CM | POA: Diagnosis not present

## 2019-08-05 DIAGNOSIS — E7800 Pure hypercholesterolemia, unspecified: Secondary | ICD-10-CM | POA: Diagnosis not present

## 2019-08-05 DIAGNOSIS — F419 Anxiety disorder, unspecified: Secondary | ICD-10-CM | POA: Diagnosis not present

## 2019-08-05 DIAGNOSIS — E559 Vitamin D deficiency, unspecified: Secondary | ICD-10-CM | POA: Diagnosis not present

## 2019-08-05 DIAGNOSIS — Z0000 Encounter for general adult medical examination without abnormal findings: Secondary | ICD-10-CM | POA: Diagnosis not present

## 2019-08-12 DIAGNOSIS — Z0000 Encounter for general adult medical examination without abnormal findings: Secondary | ICD-10-CM | POA: Diagnosis not present

## 2019-08-12 DIAGNOSIS — Z23 Encounter for immunization: Secondary | ICD-10-CM | POA: Diagnosis not present

## 2019-08-12 DIAGNOSIS — F4323 Adjustment disorder with mixed anxiety and depressed mood: Secondary | ICD-10-CM | POA: Diagnosis not present

## 2019-08-22 DIAGNOSIS — Z1211 Encounter for screening for malignant neoplasm of colon: Secondary | ICD-10-CM | POA: Diagnosis not present

## 2019-08-22 DIAGNOSIS — K5190 Ulcerative colitis, unspecified, without complications: Secondary | ICD-10-CM | POA: Diagnosis not present

## 2019-08-22 DIAGNOSIS — Z1509 Genetic susceptibility to other malignant neoplasm: Secondary | ICD-10-CM | POA: Diagnosis not present

## 2019-08-26 DIAGNOSIS — F4323 Adjustment disorder with mixed anxiety and depressed mood: Secondary | ICD-10-CM | POA: Diagnosis not present

## 2019-09-02 ENCOUNTER — Other Ambulatory Visit: Payer: Self-pay | Admitting: Internal Medicine

## 2019-09-02 DIAGNOSIS — Z1231 Encounter for screening mammogram for malignant neoplasm of breast: Secondary | ICD-10-CM

## 2019-09-12 DIAGNOSIS — F4323 Adjustment disorder with mixed anxiety and depressed mood: Secondary | ICD-10-CM | POA: Diagnosis not present

## 2019-09-23 DIAGNOSIS — F4323 Adjustment disorder with mixed anxiety and depressed mood: Secondary | ICD-10-CM | POA: Diagnosis not present

## 2019-10-09 DIAGNOSIS — F4323 Adjustment disorder with mixed anxiety and depressed mood: Secondary | ICD-10-CM | POA: Diagnosis not present

## 2019-10-21 ENCOUNTER — Ambulatory Visit
Admission: RE | Admit: 2019-10-21 | Discharge: 2019-10-21 | Disposition: A | Discharge status: Home / Self Care | Payer: BC Managed Care – PPO | Source: Ambulatory Visit | Attending: Internal Medicine | Admitting: Internal Medicine

## 2019-10-21 ENCOUNTER — Other Ambulatory Visit: Payer: Self-pay

## 2019-10-21 DIAGNOSIS — Z1231 Encounter for screening mammogram for malignant neoplasm of breast: Secondary | ICD-10-CM | POA: Diagnosis not present

## 2019-10-22 ENCOUNTER — Other Ambulatory Visit: Payer: Self-pay | Admitting: Internal Medicine

## 2019-10-22 DIAGNOSIS — R928 Other abnormal and inconclusive findings on diagnostic imaging of breast: Secondary | ICD-10-CM

## 2019-10-23 DIAGNOSIS — F4323 Adjustment disorder with mixed anxiety and depressed mood: Secondary | ICD-10-CM | POA: Diagnosis not present

## 2019-10-28 ENCOUNTER — Other Ambulatory Visit: Payer: Self-pay

## 2019-10-28 ENCOUNTER — Other Ambulatory Visit: Payer: Self-pay | Admitting: Internal Medicine

## 2019-10-28 ENCOUNTER — Ambulatory Visit
Admission: RE | Admit: 2019-10-28 | Discharge: 2019-10-28 | Disposition: A | Discharge status: Home / Self Care | Payer: BC Managed Care – PPO | Source: Ambulatory Visit | Attending: Internal Medicine | Admitting: Internal Medicine

## 2019-10-28 ENCOUNTER — Ambulatory Visit: Payer: BC Managed Care – PPO

## 2019-10-28 DIAGNOSIS — R928 Other abnormal and inconclusive findings on diagnostic imaging of breast: Secondary | ICD-10-CM

## 2019-11-04 DIAGNOSIS — D128 Benign neoplasm of rectum: Secondary | ICD-10-CM | POA: Diagnosis not present

## 2019-11-04 DIAGNOSIS — K626 Ulcer of anus and rectum: Secondary | ICD-10-CM | POA: Diagnosis not present

## 2019-11-04 DIAGNOSIS — K3189 Other diseases of stomach and duodenum: Secondary | ICD-10-CM | POA: Diagnosis not present

## 2019-11-04 DIAGNOSIS — K319 Disease of stomach and duodenum, unspecified: Secondary | ICD-10-CM | POA: Diagnosis not present

## 2019-11-04 DIAGNOSIS — Z1509 Genetic susceptibility to other malignant neoplasm: Secondary | ICD-10-CM | POA: Diagnosis not present

## 2019-11-04 DIAGNOSIS — Z1211 Encounter for screening for malignant neoplasm of colon: Secondary | ICD-10-CM | POA: Diagnosis not present

## 2019-11-04 DIAGNOSIS — F4323 Adjustment disorder with mixed anxiety and depressed mood: Secondary | ICD-10-CM | POA: Diagnosis not present

## 2019-11-04 DIAGNOSIS — K635 Polyp of colon: Secondary | ICD-10-CM | POA: Diagnosis not present

## 2019-11-21 DIAGNOSIS — F4323 Adjustment disorder with mixed anxiety and depressed mood: Secondary | ICD-10-CM | POA: Diagnosis not present

## 2019-11-27 DIAGNOSIS — Z6822 Body mass index (BMI) 22.0-22.9, adult: Secondary | ICD-10-CM | POA: Diagnosis not present

## 2019-11-27 DIAGNOSIS — Z01419 Encounter for gynecological examination (general) (routine) without abnormal findings: Secondary | ICD-10-CM | POA: Diagnosis not present

## 2019-12-09 DIAGNOSIS — F4323 Adjustment disorder with mixed anxiety and depressed mood: Secondary | ICD-10-CM | POA: Diagnosis not present

## 2019-12-17 DIAGNOSIS — Z1509 Genetic susceptibility to other malignant neoplasm: Secondary | ICD-10-CM | POA: Diagnosis not present

## 2019-12-17 DIAGNOSIS — R1033 Periumbilical pain: Secondary | ICD-10-CM | POA: Diagnosis not present

## 2019-12-17 DIAGNOSIS — R140 Abdominal distension (gaseous): Secondary | ICD-10-CM | POA: Diagnosis not present

## 2019-12-25 DIAGNOSIS — F4323 Adjustment disorder with mixed anxiety and depressed mood: Secondary | ICD-10-CM | POA: Diagnosis not present

## 2020-01-30 DIAGNOSIS — F4323 Adjustment disorder with mixed anxiety and depressed mood: Secondary | ICD-10-CM | POA: Diagnosis not present

## 2020-02-06 DIAGNOSIS — F4323 Adjustment disorder with mixed anxiety and depressed mood: Secondary | ICD-10-CM | POA: Diagnosis not present

## 2020-02-07 DIAGNOSIS — R2232 Localized swelling, mass and lump, left upper limb: Secondary | ICD-10-CM | POA: Diagnosis not present

## 2020-02-07 DIAGNOSIS — R200 Anesthesia of skin: Secondary | ICD-10-CM | POA: Diagnosis not present

## 2020-02-18 DIAGNOSIS — F4323 Adjustment disorder with mixed anxiety and depressed mood: Secondary | ICD-10-CM | POA: Diagnosis not present

## 2020-02-20 DIAGNOSIS — R2232 Localized swelling, mass and lump, left upper limb: Secondary | ICD-10-CM | POA: Diagnosis not present

## 2020-02-27 DIAGNOSIS — Z803 Family history of malignant neoplasm of breast: Secondary | ICD-10-CM | POA: Diagnosis not present

## 2020-02-27 DIAGNOSIS — Z800 Family history of malignant neoplasm of digestive organs: Secondary | ICD-10-CM | POA: Diagnosis not present

## 2020-03-17 ENCOUNTER — Other Ambulatory Visit: Payer: Self-pay | Admitting: Orthopedic Surgery

## 2020-03-17 DIAGNOSIS — L729 Follicular cyst of the skin and subcutaneous tissue, unspecified: Secondary | ICD-10-CM | POA: Diagnosis not present

## 2020-03-17 DIAGNOSIS — R2232 Localized swelling, mass and lump, left upper limb: Secondary | ICD-10-CM | POA: Diagnosis not present

## 2020-03-25 DIAGNOSIS — F4323 Adjustment disorder with mixed anxiety and depressed mood: Secondary | ICD-10-CM | POA: Diagnosis not present

## 2020-04-13 DIAGNOSIS — Z1509 Genetic susceptibility to other malignant neoplasm: Secondary | ICD-10-CM | POA: Diagnosis not present

## 2020-04-13 DIAGNOSIS — Z1211 Encounter for screening for malignant neoplasm of colon: Secondary | ICD-10-CM | POA: Diagnosis not present

## 2020-04-13 DIAGNOSIS — K5120 Ulcerative (chronic) proctitis without complications: Secondary | ICD-10-CM | POA: Diagnosis not present

## 2020-04-15 DIAGNOSIS — F4323 Adjustment disorder with mixed anxiety and depressed mood: Secondary | ICD-10-CM | POA: Diagnosis not present

## 2020-05-13 DIAGNOSIS — F4323 Adjustment disorder with mixed anxiety and depressed mood: Secondary | ICD-10-CM | POA: Diagnosis not present

## 2020-06-03 DIAGNOSIS — F4323 Adjustment disorder with mixed anxiety and depressed mood: Secondary | ICD-10-CM | POA: Diagnosis not present

## 2020-06-12 DIAGNOSIS — F5101 Primary insomnia: Secondary | ICD-10-CM | POA: Diagnosis not present

## 2020-06-12 DIAGNOSIS — J320 Chronic maxillary sinusitis: Secondary | ICD-10-CM | POA: Diagnosis not present

## 2020-06-23 DIAGNOSIS — D2372 Other benign neoplasm of skin of left lower limb, including hip: Secondary | ICD-10-CM | POA: Diagnosis not present

## 2020-06-23 DIAGNOSIS — L821 Other seborrheic keratosis: Secondary | ICD-10-CM | POA: Diagnosis not present

## 2020-06-23 DIAGNOSIS — L814 Other melanin hyperpigmentation: Secondary | ICD-10-CM | POA: Diagnosis not present

## 2020-06-24 DIAGNOSIS — F4323 Adjustment disorder with mixed anxiety and depressed mood: Secondary | ICD-10-CM | POA: Diagnosis not present

## 2020-07-15 DIAGNOSIS — F4323 Adjustment disorder with mixed anxiety and depressed mood: Secondary | ICD-10-CM | POA: Diagnosis not present

## 2020-07-30 DIAGNOSIS — F4323 Adjustment disorder with mixed anxiety and depressed mood: Secondary | ICD-10-CM | POA: Diagnosis not present

## 2020-08-11 DIAGNOSIS — F4323 Adjustment disorder with mixed anxiety and depressed mood: Secondary | ICD-10-CM | POA: Diagnosis not present

## 2020-08-12 DIAGNOSIS — E559 Vitamin D deficiency, unspecified: Secondary | ICD-10-CM | POA: Diagnosis not present

## 2020-08-12 DIAGNOSIS — Z0000 Encounter for general adult medical examination without abnormal findings: Secondary | ICD-10-CM | POA: Diagnosis not present

## 2020-08-12 DIAGNOSIS — E7800 Pure hypercholesterolemia, unspecified: Secondary | ICD-10-CM | POA: Diagnosis not present

## 2020-08-19 DIAGNOSIS — D72819 Decreased white blood cell count, unspecified: Secondary | ICD-10-CM | POA: Diagnosis not present

## 2020-08-19 DIAGNOSIS — Z0000 Encounter for general adult medical examination without abnormal findings: Secondary | ICD-10-CM | POA: Diagnosis not present

## 2020-08-19 DIAGNOSIS — D7219 Other eosinophilia: Secondary | ICD-10-CM | POA: Diagnosis not present

## 2020-08-19 DIAGNOSIS — R319 Hematuria, unspecified: Secondary | ICD-10-CM | POA: Diagnosis not present

## 2020-08-19 DIAGNOSIS — E673 Hypervitaminosis D: Secondary | ICD-10-CM | POA: Diagnosis not present

## 2020-08-19 DIAGNOSIS — E7800 Pure hypercholesterolemia, unspecified: Secondary | ICD-10-CM | POA: Diagnosis not present

## 2020-08-19 DIAGNOSIS — Z23 Encounter for immunization: Secondary | ICD-10-CM | POA: Diagnosis not present

## 2020-08-25 DIAGNOSIS — F4323 Adjustment disorder with mixed anxiety and depressed mood: Secondary | ICD-10-CM | POA: Diagnosis not present

## 2020-09-08 DIAGNOSIS — F4323 Adjustment disorder with mixed anxiety and depressed mood: Secondary | ICD-10-CM | POA: Diagnosis not present

## 2020-09-14 ENCOUNTER — Other Ambulatory Visit: Payer: Self-pay | Admitting: Internal Medicine

## 2020-09-14 DIAGNOSIS — Z1231 Encounter for screening mammogram for malignant neoplasm of breast: Secondary | ICD-10-CM

## 2020-09-22 DIAGNOSIS — F4323 Adjustment disorder with mixed anxiety and depressed mood: Secondary | ICD-10-CM | POA: Diagnosis not present

## 2020-10-14 DIAGNOSIS — F4323 Adjustment disorder with mixed anxiety and depressed mood: Secondary | ICD-10-CM | POA: Diagnosis not present

## 2020-10-21 DIAGNOSIS — E559 Vitamin D deficiency, unspecified: Secondary | ICD-10-CM | POA: Diagnosis not present

## 2020-10-23 ENCOUNTER — Other Ambulatory Visit: Payer: Self-pay

## 2020-10-23 ENCOUNTER — Ambulatory Visit
Admission: RE | Admit: 2020-10-23 | Discharge: 2020-10-23 | Disposition: A | Discharge status: Home / Self Care | Payer: BC Managed Care – PPO | Source: Ambulatory Visit | Attending: Internal Medicine | Admitting: Internal Medicine

## 2020-10-23 DIAGNOSIS — Z1231 Encounter for screening mammogram for malignant neoplasm of breast: Secondary | ICD-10-CM

## 2020-11-11 DIAGNOSIS — F4323 Adjustment disorder with mixed anxiety and depressed mood: Secondary | ICD-10-CM | POA: Diagnosis not present

## 2020-11-25 DIAGNOSIS — H524 Presbyopia: Secondary | ICD-10-CM | POA: Diagnosis not present

## 2020-11-25 DIAGNOSIS — H5213 Myopia, bilateral: Secondary | ICD-10-CM | POA: Diagnosis not present

## 2020-11-30 DIAGNOSIS — Z01419 Encounter for gynecological examination (general) (routine) without abnormal findings: Secondary | ICD-10-CM | POA: Diagnosis not present

## 2020-11-30 DIAGNOSIS — Z6822 Body mass index (BMI) 22.0-22.9, adult: Secondary | ICD-10-CM | POA: Diagnosis not present

## 2020-12-21 DIAGNOSIS — E559 Vitamin D deficiency, unspecified: Secondary | ICD-10-CM | POA: Diagnosis not present

## 2020-12-23 DIAGNOSIS — F4323 Adjustment disorder with mixed anxiety and depressed mood: Secondary | ICD-10-CM | POA: Diagnosis not present

## 2021-01-14 DIAGNOSIS — R0683 Snoring: Secondary | ICD-10-CM | POA: Diagnosis not present

## 2021-01-25 DIAGNOSIS — G4700 Insomnia, unspecified: Secondary | ICD-10-CM | POA: Diagnosis not present

## 2021-01-25 DIAGNOSIS — G4719 Other hypersomnia: Secondary | ICD-10-CM | POA: Diagnosis not present

## 2021-01-27 DIAGNOSIS — F4323 Adjustment disorder with mixed anxiety and depressed mood: Secondary | ICD-10-CM | POA: Diagnosis not present

## 2021-02-17 DIAGNOSIS — E559 Vitamin D deficiency, unspecified: Secondary | ICD-10-CM | POA: Diagnosis not present

## 2021-02-18 DIAGNOSIS — F4323 Adjustment disorder with mixed anxiety and depressed mood: Secondary | ICD-10-CM | POA: Diagnosis not present

## 2021-03-05 DIAGNOSIS — Z800 Family history of malignant neoplasm of digestive organs: Secondary | ICD-10-CM | POA: Diagnosis not present

## 2021-03-05 DIAGNOSIS — Z803 Family history of malignant neoplasm of breast: Secondary | ICD-10-CM | POA: Diagnosis not present

## 2021-03-06 ENCOUNTER — Other Ambulatory Visit: Payer: Self-pay

## 2021-03-06 ENCOUNTER — Emergency Department (HOSPITAL_COMMUNITY): Payer: BC Managed Care – PPO

## 2021-03-06 ENCOUNTER — Emergency Department (HOSPITAL_COMMUNITY)
Admission: EM | Admit: 2021-03-06 | Discharge: 2021-03-06 | Disposition: A | Discharge status: Home / Self Care | Payer: BC Managed Care – PPO | Attending: Emergency Medicine | Admitting: Emergency Medicine

## 2021-03-06 DIAGNOSIS — R110 Nausea: Secondary | ICD-10-CM | POA: Diagnosis not present

## 2021-03-06 DIAGNOSIS — I493 Ventricular premature depolarization: Secondary | ICD-10-CM

## 2021-03-06 DIAGNOSIS — R112 Nausea with vomiting, unspecified: Secondary | ICD-10-CM | POA: Diagnosis not present

## 2021-03-06 DIAGNOSIS — R002 Palpitations: Secondary | ICD-10-CM | POA: Diagnosis not present

## 2021-03-06 LAB — URINALYSIS, ROUTINE W REFLEX MICROSCOPIC
Bilirubin Urine: NEGATIVE
Glucose, UA: NEGATIVE mg/dL
Hgb urine dipstick: NEGATIVE
Ketones, ur: NEGATIVE mg/dL
Nitrite: NEGATIVE
Protein, ur: NEGATIVE mg/dL
Specific Gravity, Urine: 1.004 — ABNORMAL LOW (ref 1.005–1.030)
pH: 6 (ref 5.0–8.0)

## 2021-03-06 LAB — CBC WITH DIFFERENTIAL/PLATELET
Abs Immature Granulocytes: 0.02 10*3/uL (ref 0.00–0.07)
Basophils Absolute: 0 10*3/uL (ref 0.0–0.1)
Basophils Relative: 1 %
Eosinophils Absolute: 0 10*3/uL (ref 0.0–0.5)
Eosinophils Relative: 0 %
HCT: 40.1 % (ref 36.0–46.0)
Hemoglobin: 13.1 g/dL (ref 12.0–15.0)
Immature Granulocytes: 0 %
Lymphocytes Relative: 29 %
Lymphs Abs: 1.9 10*3/uL (ref 0.7–4.0)
MCH: 29.6 pg (ref 26.0–34.0)
MCHC: 32.7 g/dL (ref 30.0–36.0)
MCV: 90.7 fL (ref 80.0–100.0)
Monocytes Absolute: 0.5 10*3/uL (ref 0.1–1.0)
Monocytes Relative: 8 %
Neutro Abs: 3.9 10*3/uL (ref 1.7–7.7)
Neutrophils Relative %: 62 %
Platelets: 275 10*3/uL (ref 150–400)
RBC: 4.42 MIL/uL (ref 3.87–5.11)
RDW: 12.8 % (ref 11.5–15.5)
WBC: 6.3 10*3/uL (ref 4.0–10.5)
nRBC: 0 % (ref 0.0–0.2)

## 2021-03-06 LAB — COMPREHENSIVE METABOLIC PANEL
ALT: 14 U/L (ref 0–44)
AST: 19 U/L (ref 15–41)
Albumin: 4.5 g/dL (ref 3.5–5.0)
Alkaline Phosphatase: 65 U/L (ref 38–126)
Anion gap: 9 (ref 5–15)
BUN: 6 mg/dL (ref 6–20)
CO2: 24 mmol/L (ref 22–32)
Calcium: 9.4 mg/dL (ref 8.9–10.3)
Chloride: 104 mmol/L (ref 98–111)
Creatinine, Ser: 0.78 mg/dL (ref 0.44–1.00)
GFR, Estimated: >60 mL/min (ref >60)
Glucose, Bld: 90 mg/dL (ref 70–99)
Potassium: 3.7 mmol/L (ref 3.5–5.1)
Sodium: 137 mmol/L (ref 135–145)
Total Bilirubin: 0.2 mg/dL — ABNORMAL LOW (ref 0.3–1.2)
Total Protein: 7 g/dL (ref 6.5–8.1)

## 2021-03-06 LAB — TSH: TSH: 1.208 u[IU]/mL (ref 0.350–4.500)

## 2021-03-06 LAB — LIPASE, BLOOD: Lipase: 40 U/L (ref 11–51)

## 2021-03-06 LAB — TROPONIN I (HIGH SENSITIVITY)
Troponin I (High Sensitivity): <2 ng/L (ref <18)
Troponin I (High Sensitivity): <2 ng/L (ref <18)

## 2021-03-06 LAB — D-DIMER, QUANTITATIVE: D-Dimer, Quant: 0.33 ug/mL-FEU (ref 0.00–0.50)

## 2021-03-06 LAB — MAGNESIUM: Magnesium: 2 mg/dL (ref 1.7–2.4)

## 2021-03-06 MED ORDER — ONDANSETRON 4 MG PO TBDP: 4.0000 mg | ORAL_TABLET | Freq: Three times a day (TID) | ORAL | 0 refills | Status: AC | PRN

## 2021-03-06 MED ORDER — SODIUM CHLORIDE 0.9 % IV BOLUS
500.0000 mL | Freq: Once | INTRAVENOUS | Status: AC
  Administered 2021-03-06: 500 mL via INTRAVENOUS

## 2021-03-06 MED ORDER — MAGNESIUM OXIDE 400 MG PO TABS: 400.0000 mg | ORAL_TABLET | Freq: Every day | ORAL | 0 refills | Status: DC

## 2021-03-06 NOTE — ED Triage Notes (Signed)
Patient says she has small bowel bacteria overgrowth and no colon. Patient has j pouch in place. Patient says last night she felt very bad and nauseated and her heart started racing and felt like it was flipping around. Patient denies cardiac history. Patient says she is not feeling this way right this moment in triage. Denies pain

## 2021-03-06 NOTE — Discharge Instructions (Signed)
Follow up with cardiology if you continue to have these palpitations.  If they worsen please return to the emergency

## 2021-03-06 NOTE — ED Triage Notes (Signed)
Emergency Medicine Provider Triage Evaluation Note  Pamela Greene , a 55 y.o. female  was evaluated in triage.  Pt complains of palpitations which she describes has her heart skipping a beat. Palpitations started around 8:30PM last night after becoming acutely nauseous when eating. She notes she is currently being treated for small bowel bacteria overgrowth with Rifaximin since Thursday. No cardiac history. Admits to chest tightness/burning sensation 1-2 hours prior to arrival which has completely resolved. No history of A. Fib. Denies associated diaphoresis, numbness/tingling, and shortness of breath. She notes mild diffuse abdominal pain. She is being followed by Dr. Loreta Ave.  Review of Systems  Positive: Palpitations, chest pain Negative: fever  Physical Exam  Ht 5\' 3"  (1.6 m)   Wt 55.8 kg   BMI 21.79 kg/m  Gen:   Awake, no distress   HEENT:  Atraumatic  Resp:  Normal effort  Cardiac:  Normal rate  Abd:   Nondistended, nontender  MSK:   Moves extremities without difficulty  Neuro:  Speech clear   Medical Decision Making  Medically screening exam initiated at 12:52 PM.  Appropriate orders placed.  Pamela Greene was informed that the remainder of the evaluation will be completed by another provider, this initial triage assessment does not replace that evaluation, and the importance of remaining in the ED until their evaluation is complete.  Clinical Impression  Palpitations and abdominal pain. Palpitations sound like possible PVCs. Given chest tightness prior to arrival, cardiac labs ordered in addition to abdominal labs.    Pamela Greene, Pamela Greene 03/06/21 1305

## 2021-03-06 NOTE — ED Provider Notes (Signed)
[facility]-EMERGENCY DEPT Provider Note   CSN: [id number] Arrival date & time: 03/06/21  1152    History Chief Complaint  Patient presents with  . Palpitations    Pamela Greene is a 55 y.o. female with past medical history significant for anxiety,  who presents for evaluation of palpitations.  Began 830 yesterday evening.  States she recently started antibiotics, Rifaximin for SIBO 3 days ago.  Did have some chest tightness which resolved prior to arrival.  No unilateral leg swelling, redness or warmth.  No history of PE or DVT.  No headache, syncope, paresthesias, weakness, abdominal pain, diarrhea, dysuria.  Denies additional aggravating or alleviating factors. Has not taken anything for symptoms.  Never anything like this previously.  She denies any exertional chest pain.  History obtained from patient and past medical records.  No interpreter used  HPI     No past medical history on file.  Patient Active Problem List   Diagnosis Date Noted  . Anxiety state, unspecified 10/15/2013    Past Surgical History:  Procedure Laterality Date  . ANAL SPHINCTER PROSTHESIS PLACEMENT    . AUGMENTATION MAMMAPLASTY Bilateral      OB History   No obstetric history on file.     Family History  Problem Relation Age of Onset  . Breast cancer Paternal Aunt   . Breast cancer Paternal Grandmother     Social History   Tobacco Use  . Smoking status: Never Smoker  . Smokeless tobacco: Never Used  Substance Use Topics  . Alcohol use: Yes    Home Medications Prior to Admission medications   Medication Sig Start Date End Date Taking? Authorizing Provider  cetirizine (ZYRTEC) 10 MG tablet Take 10 mg by mouth daily.   Yes [provider]  magnesium oxide (MAG-OX) 400 MG tablet Take 1 tablet (400 mg total) by mouth daily for 14 days. 03/06/21 03/20/21 Yes Vail Vuncannon A, PA-C  rifaximin (XIFAXAN) 550 MG TABS tablet Take 550 mg by mouth as directed.  Take for BID for 5 Days when onset small intestine overgrowth   Yes [provider]  traZODone (DESYREL) 50 MG tablet Take 50 mg by mouth at bedtime. 02/26/21  Yes [provider]  TURMERIC PO Take 1 capsule by mouth daily.   Yes [provider]  venlafaxine (EFFEXOR) 50 MG tablet Take 50 mg by mouth 2 (two) times daily with a meal. 12/30/20  Yes [provider]  amoxicillin (AMOXIL) 500 MG capsule Take 1 capsule (500 mg total) by mouth 3 (three) times daily. Patient not taking: Reported on 11/10/2014 03/07/14   Linna Hoff, MD  ondansetron (ZOFRAN ODT) 4 MG disintegrating tablet Take 1 tablet (4 mg total) by mouth every 8 (eight) hours as needed for nausea or vomiting. 03/06/21   Evangelynn Lochridge A, PA-C    Allergies    Patient has no known allergies.  Review of Systems   Review of Systems  Constitutional: Negative.   HENT: Negative.   Respiratory: Positive for chest tightness. Negative for apnea, cough, choking, shortness of breath, wheezing and stridor.   Cardiovascular: Positive for palpitations. Negative for chest pain and leg swelling.  Gastrointestinal: Positive for nausea. Negative for abdominal pain, constipation, diarrhea, rectal pain and vomiting.  Genitourinary: Negative.   Musculoskeletal: Negative.   Skin: Negative.   Neurological: Negative.   All other systems reviewed and are negative.  Physical Exam Updated Vital Signs BP 116/76   Pulse 74   Temp  98.5 F (36.9 C) (Oral)   Resp 17   Ht 5\' 3"  (1.6 m)   Wt 55.8 kg   SpO2 99%   BMI 21.79 kg/m   Physical Exam Vitals and nursing note reviewed.  Constitutional:      General: She is not in acute distress.    Appearance: She is well-developed. She is not ill-appearing, toxic-appearing or diaphoretic.  HENT:     Head: Normocephalic and atraumatic.     Nose: Nose normal.     Mouth/Throat:     Mouth: Mucous membranes are moist.  Eyes:     Pupils: Pupils are equal, round, and  reactive to light.  Cardiovascular:     Rate and Rhythm: Normal rate.     Pulses: Normal pulses.     Heart sounds: Normal heart sounds.  Pulmonary:     Effort: Pulmonary effort is normal. No respiratory distress.     Breath sounds: Normal breath sounds.     Comments: Clear to auscultation bilaterally.  Speaks in full sentences without difficulty Abdominal:     General: Bowel sounds are normal. There is no distension.     Palpations: Abdomen is soft.     Tenderness: There is no abdominal tenderness. There is no right CVA tenderness, left CVA tenderness or guarding.     Comments: Abdomen soft, nontender without rebound or guarding  Musculoskeletal:        General: No swelling, tenderness, deformity or signs of injury. Normal range of motion.     Cervical back: Normal range of motion.     Right lower leg: No edema.     Left lower leg: No edema.     Comments: No bony tenderness.  Moves all 4 extremities at difficulty.  Compartments soft.  Skin:    General: Skin is warm and dry.     Capillary Refill: Capillary refill takes less than 2 seconds.  Neurological:     General: No focal deficit present.     Mental Status: She is alert and oriented to person, place, and time.    ED Results / Procedures / Treatments   Labs (all labs ordered are listed, but only abnormal results are displayed) Labs Reviewed  COMPREHENSIVE METABOLIC PANEL - Abnormal; Notable for the following components:      Result Value   Total Bilirubin 0.2 (*)    All other components within normal limits  URINALYSIS, ROUTINE W REFLEX MICROSCOPIC - Abnormal; Notable for the following components:   Color, Urine STRAW (*)    Specific Gravity, Urine 1.004 (*)    Leukocytes,Ua SMALL (*)    Bacteria, UA RARE (*)    All other components within normal limits  CBC WITH DIFFERENTIAL/PLATELET  LIPASE, BLOOD  MAGNESIUM  TSH  D-DIMER, QUANTITATIVE  TROPONIN I (HIGH SENSITIVITY)  TROPONIN I (HIGH SENSITIVITY)  TROPONIN I (HIGH  SENSITIVITY)    EKG EKG Interpretation  Date/Time:  Saturday March 06 2021 13:11:46 EDT Ventricular Rate:  68 PR Interval:  152 QRS Duration: 82 QT Interval:  406 QTC Calculation: 431 R Axis:   175 Text Interpretation: Normal sinus rhythm Right axis deviation Abnormal ECG Confirmed by [id number] [phone]) on 03/06/2021 1:14:45 PM   Radiology DG Chest Portable 1 View  Result Date: 03/06/2021 CLINICAL DATA:  Heart palpitations. EXAM: PORTABLE CHEST 1 VIEW COMPARISON:  None. FINDINGS: Single view of the chest demonstrates clear lungs. There may be a component of hyperinflation. Heart and mediastinum are within normal limits. Trachea is midline. No  acute bone abnormality. There may be a tiny calcified granuloma in the left upper lung. IMPRESSION: No active disease. Electronically Signed   By: Richarda Overlie M.D.   On: 03/06/2021 13:27    Procedures Procedures   Medications Ordered in ED Medications  sodium chloride 0.9 % bolus 500 mL (0 mLs Intravenous Stopped 03/06/21 1641)    ED Course  I have reviewed the triage vital signs and the nursing notes.  Pertinent labs & imaging results that were available during my care of the patient were reviewed by me and considered in my medical decision making (see chart for details).  55 year old presents for evaluation of palpitations.  She is afebrile, nonseptic, not ill-appearing.  Her heart and lungs are clear but abdomen soft, nontender.  She has a nonfocal neuro exam without deficits.  She is Wells criteria low risk.  Her symptoms not seem consistent with ACS, PE or dissection.  Electrolytes within normal limits.  Does have occasional PVCs on the monitor.  This is likely the cause of her palpitations.  No extended arrhythmias.  Coincidently this did start when she started taking a new antibiotic.  She is no prolonged QT interval on her EKG.  Patient has 2 days left of this antibiotic and would like to complete this.  We will start on magnesium to  see if this helps with her palpitations.  Unfortunately she has low heart rate in the 60s/70s at baseline and I do not feel beta-blockers are indicated at this time due to low heart rate.  Labs and imaging personally reviewed and interpreted: CBC without leukocytosis metabolic panel without electrolyte, renal abnormality Delta troponin flat, negative D-dimer within normal limits Magnesium 2.0 Lipase 40 TSH within normal limit X-ray without any acute cardiopulmonary abnormality  Patient reassessed.  Discussed work-up here in ED.  She is comfortable following up outpatient with cardiology.  I have encouraged to return if she has any worsening symptoms.  She is agreeable for this  Chest pain is not likely of cardiac or pulmonary etiology d/t presentation,  VSS, no tracheal deviation, no JVD or new murmur, RRR, breath sounds equal bilaterally, EKG without acute abnormalities, negative troponin, and negative CXR. Pt has been advised to return to the ED if CP becomes exertional, associated with diaphoresis or nausea, radiates to left jaw/arm, worsens or becomes concerning in any way.  The patient has been appropriately medically screened and/or stabilized in the ED. I have low suspicion for any other emergent medical condition which would require further screening, evaluation or treatment in the ED or require inpatient management.  Patient is hemodynamically stable and in no acute distress.  Patient able to ambulate in department prior to ED.  Evaluation does not show acute pathology that would require ongoing or additional emergent interventions while in the emergency department or further inpatient treatment.  I have discussed the diagnosis with the patient and answered all questions.  Pain is been managed while in the emergency department and patient has no further complaints prior to discharge.  Patient is comfortable with plan discussed in room and is stable for discharge at this time.  I have  discussed strict return precautions for returning to the emergency department.  Patient was encouraged to follow-up with PCP/specialist refer to at discharge.     MDM Rules/Calculators/A&P                           Final Clinical Impression(s) /  ED Diagnoses Final diagnoses:  PVC (premature ventricular contraction)  Nausea    Rx / DC Orders ED Discharge Orders         Ordered    ondansetron (ZOFRAN ODT) 4 MG disintegrating tablet  Every 8 hours PRN        03/06/21 1743    magnesium oxide (MAG-OX) 400 MG tablet  Daily        03/06/21 1743           Takina Busser A, PA-C 03/06/21 1750    Wynetta FinesMessick, Peter C, MD 03/10/21 [phone]

## 2021-03-16 ENCOUNTER — Encounter: Payer: Self-pay | Admitting: Cardiovascular Disease

## 2021-03-16 NOTE — Progress Notes (Signed)
Cardiology Office Note:    Date:  03/17/2021   ID:  Pamela Greene, DOB [birth year], MRN [id number]  PCP:  Pamela Brunette, MD   [facility] Providers Cardiologist:  None     Referring MD: Pamela Brunette, MD   Chief Complaint  Patient presents with  . Palpitations     Mar 17, 2021   Pamela Greene is a 55 y.o. female with a hx of palpitations /PVCs We were asked to see her for further evaluation of her PVCs by Dr. Renne Greene.  Has been having difficulty with Vit D levels  Has been diagnosed with OSA Waiting on CPAP   Has had some bacterial overgrowth ,  Started Rifaximin for her small intestine overgorowth   Had multiple " hard heart beats" several weekends ago  ( clinically sound like PVCs) Associated with some dyspena Had a cough associated with it   Is feeling better the past few days   Works as a Materials engineer )     History reviewed. No pertinent past medical history.  Past Surgical History:  Procedure Laterality Date  . ANAL SPHINCTER PROSTHESIS PLACEMENT    . AUGMENTATION MAMMAPLASTY Bilateral     Current Medications: Current Meds  Medication Sig  . magnesium oxide (MAG-OX) 400 (240 Mg) MG tablet Take 1 tablet by mouth daily.  . mesalamine (CANASA) 1000 MG suppository 1 suppository  . ondansetron (ZOFRAN ODT) 4 MG disintegrating tablet Take 1 tablet (4 mg total) by mouth every 8 (eight) hours as needed for nausea or vomiting.  . traZODone (DESYREL) 50 MG tablet Take 50 mg by mouth at bedtime.  . TURMERIC PO Take 1 capsule by mouth daily.  Marland Kitchen venlafaxine (EFFEXOR) 50 MG tablet Take 50 mg by mouth 2 (two) times daily with a meal.  . [DISCONTINUED] magnesium oxide (MAG-OX) 400 MG tablet Take 1 tablet (400 mg total) by mouth daily for 14 days.     Allergies:   Patient has no known allergies.   Social History   Socioeconomic History  . Marital status: Married    Spouse name: Not on file  . Number of children: Not on file  .  Years of education: Not on file  . Highest education level: Not on file  Occupational History  . Not on file  Tobacco Use  . Smoking status: Never Smoker  . Smokeless tobacco: Never Used  Substance and Sexual Activity  . Alcohol use: Yes  . Drug use: Not on file  . Sexual activity: Not on file  Other Topics Concern  . Not on file  Social History Narrative  . Not on file   Social Determinants of Health   Financial Resource Strain: Not on file  Food Insecurity: Not on file  Transportation Needs: Not on file  Physical Activity: Not on file  Stress: Not on file  Social Connections: Not on file     Family History: The patient's family history includes Breast cancer in her paternal aunt and paternal grandmother.  ROS:   Please see the history of present illness.     All other systems reviewed and are negative.  EKGs/Labs/Other Studies Reviewed:    The following studies were reviewed today:   EKG:  May 11,, 2022:  NSR at 70 .   No ST or T wave changes.   Recent Labs: 03/06/2021: ALT 14; BUN 6; Creatinine, Ser 0.78; Hemoglobin 13.1; Magnesium 2.0; Platelets 275; Potassium 3.7; Sodium 137; TSH 1.208  Recent  Lipid Panel No results found for: CHOL, TRIG, HDL, CHOLHDL, VLDL, LDLCALC, LDLDIRECT   Risk Assessment/Calculations:       Physical Exam:    VS:  BP 98/70   Pulse 70   Ht 5\' 3"  (1.6 m)   Wt 124 lb 12.8 oz (56.6 kg)   LMP 11/07/2014   SpO2 99%   BMI 22.11 kg/m     Wt Readings from Last 3 Encounters:  03/17/21 124 lb 12.8 oz (56.6 kg)  03/06/21 123 lb (55.8 kg)     GEN:   Well nourished, well developed in no acute distress HEENT: Normal NECK: No JVD; No carotid bruits LYMPHATICS: No lymphadenopathy CARDIAC: RRR, no murmurs, rubs, gallops RESPIRATORY:  Clear to auscultation without rales, wheezing or rhonchi  ABDOMEN: Soft,,  Palpable abdominal aorta  MUSCULOSKELETAL:  No edema; No deformity  SKIN: Warm and dry NEUROLOGIC:  Alert and oriented x  3 PSYCHIATRIC:  Normal affect   ASSESSMENT:    No diagnosis found. PLAN:      1. Palpitations: Pamela Greene presents with palpitations that clinically sound consistent with premature ventricular contractions.  These occurred after she she was sick with some GI issues.  She was not eating or drinking well.  I suspect that she had some electrolyte abnormalities.  I encouraged her to stay well-hydrated.  She should try using Pedialyte or perhaps liquid IV to stay hydrated and to keep her electrolytes normal.  We will order a 14-day event monitor for further diagnosis of her palpitations.  We also discussed a Kardia mobile monitor that she can use at later times.  Cardiac exam is normal.  I do not hear any significant murmurs.  I do not think that she needs an echocardiogram at this time.  I will see her again in 3 to 4 months for follow-up visit.   2.  Palpable abdominal aorta: She is very thin.  She is never smoked.  I suspect that this is just a benign palpable abdominal aorta.  She is never had any significant abdominal pain.  She is had multiple CT scans in the past  ( for ulcerative colitis) and has never been told that she had any evidence of aneurysm.  3.  Obstructive sleep apnea: She had a home sleep study.  She was found to have obstructive sleep apnea.  She admits she has been prescribed a CPAP machine.  She still waiting on her CPAP machine   4.  Ulcerative colitis: She has had almost all of her large intestine removed due to ulcerative colitis.  She has some inflammation ( mild ) at this time.  She will follow up with her GI doctor         Medication Adjustments/Labs and Tests Ordered: Current medicines are reviewed at length with the patient today.  Concerns regarding medicines are outlined above.  No orders of the defined types were placed in this encounter.  No orders of the defined types were placed in this encounter.   Patient Instructions  Medication Instructions:   Your physician has recommended you make the following change in your medication:   START Propranolol 10mg , four times a day as needed for palpitations    *If you need a refill on your cardiac medications before your next appointment, please call your pharmacy*   Lab Work: none If you have labs (blood work) drawn today and your tests are completely normal, you will receive your results only by: [facility] MyChart Message (if you have MyChart) OR .  A paper copy in the mail If you have any lab test that is abnormal or we need to change your treatment, we will call you to review the results.   Testing/Procedures: See below  Follow-Up: At [facility], you and your health needs are our priority.  As part of our continuing mission to provide you with exceptional heart care, we have created designated Provider Care Teams.  These Care Teams include your primary Cardiologist (physician) and Advanced Practice Providers (APPs -  Physician Assistants and Nurse Practitioners) who all work together to provide you with the care you need, when you need it.  Your next appointment:   3 month(s)  The format for your next appointment:   In Person  Provider:   You may see Dr. Elease HashimotoNahser or one of the following Advanced Practice Providers on your designated Care Team:    Tereso NewcomerScott Weaver, PA-C  Vin ChupaderoBhagat, [place]C    Other Instructions Christena DeemZIO XT- Long Term Monitor Instructions   Your physician has requested you wear a ZIO patch monitor for _14 days.  This is a single patch monitor.   [employer] supplies one patch monitor per enrollment. Additional stickers are not available. Please do not apply patch if you will be having a Nuclear Stress Test, Echocardiogram, Cardiac CT, MRI, or Chest Xray during the period you would be wearing the monitor. The patch cannot be worn during these tests. You cannot remove and re-apply the ZIO XT patch monitor.  Your ZIO patch monitor will be sent Fed Ex from [employer] directly  to your home address. It may take 3-5 days to receive your monitor after you have been enrolled.  Once you have received your monitor, please review the enclosed instructions. Your monitor has already been registered assigning a specific monitor serial # to you.  Billing and Patient Assistance Program Information   We have supplied [employer] with any of your insurance information on file for billing purposes. [employer] offers a sliding scale Patient Assistance Program for patients that do not have insurance, or whose insurance does not completely cover the cost of the ZIO monitor.   You must apply for the Patient Assistance Program to qualify for this discounted rate.     To apply, please call [employer] at [phone], select option 4, then select option 2, and ask to apply for Patient Assistance Program.  [employer] will ask your household income, and how many people are in your household.  They will quote your out-of-pocket cost based on that information.  [employer] will also be able to set up a 5068-month, interest-free payment plan if needed.  Applying the monitor   Shave hair from upper left chest.  Hold abrader disc by orange tab. Rub abrader in 40 strokes over the upper left chest as indicated in your monitor instructions.  Clean area with 4 enclosed alcohol pads. Let dry.  Apply patch as indicated in monitor instructions. Patch will be placed under collarbone on left side of chest with arrow pointing upward.  Rub patch adhesive wings for 2 minutes. Remove white label marked "1". Remove the white label marked "2". Rub patch adhesive wings for 2 additional minutes.  While looking in a mirror, press and release button in center of patch. A small green light will flash 3-4 times. This will be your only indicator that the monitor has been turned on. ?  Do not shower for the first 24 hours. You may shower after the first 24 hours.  Press the button if  you feel a symptom. You will hear a small click. Record  Date, Time and Symptom in the Patient Logbook.  When you are ready to remove the patch, follow instructions on the last 2 pages of the Patient Logbook. Stick patch monitor onto the last page of Patient Logbook.  Place Patient Logbook in the blue and white box.  Use locking tab on box and tape box closed securely.  The blue and white box has prepaid postage on it. Please place it in the mailbox as soon as possible. Your physician should have your test results approximately 7 days after the monitor has been mailed back to [facility].  Call [facility] Customer Care at [phone] if you have questions regarding your ZIO XT patch monitor. Call them immediately if you see an orange light blinking on your monitor.  If your monitor falls off in less than 4 days, contact our Monitor department at [phone]. ?If your monitor becomes loose or falls off after 4 days call [employer] at [phone] for suggestions on securing your monitor.?     Signed, Kristeen Miss, MD  03/17/2021 3:42 PM    [facility]

## 2021-03-17 ENCOUNTER — Ambulatory Visit: Payer: BC Managed Care – PPO | Admitting: Cardiovascular Disease

## 2021-03-17 ENCOUNTER — Encounter: Payer: Self-pay | Admitting: Cardiovascular Disease

## 2021-03-17 ENCOUNTER — Other Ambulatory Visit: Payer: Self-pay

## 2021-03-17 VITALS — BP 98/70 | HR 70 | Ht 63.0 in | Wt 124.8 lb

## 2021-03-17 DIAGNOSIS — R0989 Other specified symptoms and signs involving the circulatory and respiratory systems: Secondary | ICD-10-CM | POA: Diagnosis not present

## 2021-03-17 DIAGNOSIS — R002 Palpitations: Secondary | ICD-10-CM | POA: Insufficient documentation

## 2021-03-17 NOTE — Patient Instructions (Signed)
Medication Instructions:  Your physician has recommended you make the following change in your medication:   START Propranolol 10mg , four times a day as needed for palpitations    *If you need a refill on your cardiac medications before your next appointment, please call your pharmacy*   Lab Work: none If you have labs (blood work) drawn today and your tests are completely normal, you will receive your results only by: MyChart Message (if you have MyChart) OR . A paper copy in the mail If you have any lab test that is abnormal or we need to change your treatment, we will call you to review the results.   Testing/Procedures: See below  Follow-Up: At [facility], you and your health needs are our priority.  As part of our continuing mission to provide you with exceptional heart care, we have created designated Provider Care Teams.  These Care Teams include your primary Cardiologist (physician) and Advanced Practice Providers (APPs -  Physician Assistants and Nurse Practitioners) who all work together to provide you with the care you need, when you need it.  Your next appointment:   3 month(s)  The format for your next appointment:   In Person  Provider:   You may see Dr. [facility] or one of the following Advanced Practice Providers on your designated Care Team:    Elease Hashimoto, PA-C  Vin [place], [place]    Other Instructions [place]- Long Term Monitor Instructions   Your physician has requested you wear a ZIO patch monitor for _14 days.  This is a single patch monitor.   [employer] supplies one patch monitor per enrollment. Additional stickers are not available. Please do not apply patch if you will be having a Nuclear Stress Test, Echocardiogram, Cardiac CT, MRI, or Chest Xray during the period you would be wearing the monitor. The patch cannot be worn during these tests. You cannot remove and re-apply the ZIO XT patch monitor.  Your ZIO patch monitor will be sent Fed Ex from  [employer] directly to your home address. It may take 3-5 days to receive your monitor after you have been enrolled.  Once you have received your monitor, please review the enclosed instructions. Your monitor has already been registered assigning a specific monitor serial # to you.  Billing and Patient Assistance Program Information   We have supplied [employer] with any of your insurance information on file for billing purposes. [employer] offers a sliding scale Patient Assistance Program for patients that do not have insurance, or whose insurance does not completely cover the cost of the ZIO monitor.   You must apply for the Patient Assistance Program to qualify for this discounted rate.     To apply, please call [employer] at [phone], select option 4, then select option 2, and ask to apply for Patient Assistance Program.  [phone] will ask your household income, and how many people are in your household.  They will quote your out-of-pocket cost based on that information.  [employer] will also be able to set up a 67-month, interest-free payment plan if needed.  Applying the monitor   Shave hair from upper left chest.  Hold abrader disc by orange tab. Rub abrader in 40 strokes over the upper left chest as indicated in your monitor instructions.  Clean area with 4 enclosed alcohol pads. Let dry.  Apply patch as indicated in monitor instructions. Patch will be placed under collarbone on left side of chest with arrow pointing upward.  Rub  patch adhesive wings for 2 minutes. Remove white label marked "1". Remove the white label marked "2". Rub patch adhesive wings for 2 additional minutes.  While looking in a mirror, press and release button in center of patch. A small green light will flash 3-4 times. This will be your only indicator that the monitor has been turned on. ?  Do not shower for the first 24 hours. You may shower after the first 24 hours.  Press the button if you feel a symptom. You will  hear a small click. Record Date, Time and Symptom in the Patient Logbook.  When you are ready to remove the patch, follow instructions on the last 2 pages of the Patient Logbook. Stick patch monitor onto the last page of Patient Logbook.  Place Patient Logbook in the blue and white box.  Use locking tab on box and tape box closed securely.  The blue and white box has prepaid postage on it. Please place it in the mailbox as soon as possible. Your physician should have your test results approximately 7 days after the monitor has been mailed back to [facility].  Call [facility] Customer Care at [phone] if you have questions regarding your ZIO XT patch monitor. Call them immediately if you see an orange light blinking on your monitor.  If your monitor falls off in less than 4 days, contact our Monitor department at [phone]. ?If your monitor becomes loose or falls off after 4 days call [employer] at [phone] for suggestions on securing your monitor.?

## 2021-03-18 DIAGNOSIS — E673 Hypervitaminosis D: Secondary | ICD-10-CM | POA: Diagnosis not present

## 2021-03-18 DIAGNOSIS — K5190 Ulcerative colitis, unspecified, without complications: Secondary | ICD-10-CM | POA: Diagnosis not present

## 2021-03-18 DIAGNOSIS — F4323 Adjustment disorder with mixed anxiety and depressed mood: Secondary | ICD-10-CM | POA: Diagnosis not present

## 2021-03-18 DIAGNOSIS — Z87442 Personal history of urinary calculi: Secondary | ICD-10-CM | POA: Diagnosis not present

## 2021-03-18 DIAGNOSIS — Z9049 Acquired absence of other specified parts of digestive tract: Secondary | ICD-10-CM | POA: Diagnosis not present

## 2021-03-19 ENCOUNTER — Telehealth: Payer: Self-pay | Admitting: Cardiovascular Disease

## 2021-03-19 MED ORDER — PROPRANOLOL HCL 10 MG PO TABS: ORAL_TABLET | ORAL | 0 refills | Status: DC

## 2021-03-19 NOTE — Telephone Encounter (Signed)
Returned call to pt.  She has been made aware that her prescription that Dr. Elease Hashimoto had asked her to start at the office visit 03/17/2021, has been sent to [facility].  Pt was very thankful for the call back.

## 2021-03-19 NOTE — Telephone Encounter (Signed)
*  STAT* If patient is at the pharmacy, call can be transferred to refill team.   1. Which medications need to be refilled? (please list name of each medication and dose if known) propranolol 10 mg  2. Which pharmacy/location (including street and city if local pharmacy) is medication to be sent to? [facility]   3. Do they need a 30 day or 90 day supply? Medication is new patient not sure

## 2021-03-25 DIAGNOSIS — K5120 Ulcerative (chronic) proctitis without complications: Secondary | ICD-10-CM | POA: Diagnosis not present

## 2021-03-25 DIAGNOSIS — Z1509 Genetic susceptibility to other malignant neoplasm: Secondary | ICD-10-CM | POA: Diagnosis not present

## 2021-03-29 DIAGNOSIS — R002 Palpitations: Secondary | ICD-10-CM

## 2021-03-30 ENCOUNTER — Ambulatory Visit (INDEPENDENT_AMBULATORY_CARE_PROVIDER_SITE_OTHER): Payer: BC Managed Care – PPO

## 2021-03-30 DIAGNOSIS — R002 Palpitations: Secondary | ICD-10-CM

## 2021-03-30 NOTE — Progress Notes (Unsigned)
Patient enrolled for [employer] to ship a 14 day ZIO XT monitor to her home. 

## 2021-04-07 DIAGNOSIS — R002 Palpitations: Secondary | ICD-10-CM | POA: Diagnosis not present

## 2021-04-20 DIAGNOSIS — F4323 Adjustment disorder with mixed anxiety and depressed mood: Secondary | ICD-10-CM | POA: Diagnosis not present

## 2021-04-21 DIAGNOSIS — R002 Palpitations: Secondary | ICD-10-CM | POA: Diagnosis not present

## 2021-05-17 DIAGNOSIS — F4323 Adjustment disorder with mixed anxiety and depressed mood: Secondary | ICD-10-CM | POA: Diagnosis not present

## 2021-06-05 DIAGNOSIS — G4733 Obstructive sleep apnea (adult) (pediatric): Secondary | ICD-10-CM | POA: Diagnosis not present

## 2021-06-16 DIAGNOSIS — F4323 Adjustment disorder with mixed anxiety and depressed mood: Secondary | ICD-10-CM | POA: Diagnosis not present

## 2021-06-25 ENCOUNTER — Ambulatory Visit: Payer: BC Managed Care – PPO | Admitting: Cardiovascular Disease

## 2021-07-06 DIAGNOSIS — K5120 Ulcerative (chronic) proctitis without complications: Secondary | ICD-10-CM | POA: Diagnosis not present

## 2021-07-06 DIAGNOSIS — M1990 Unspecified osteoarthritis, unspecified site: Secondary | ICD-10-CM | POA: Diagnosis not present

## 2021-07-06 DIAGNOSIS — G4733 Obstructive sleep apnea (adult) (pediatric): Secondary | ICD-10-CM | POA: Diagnosis not present

## 2021-07-06 DIAGNOSIS — Z1509 Genetic susceptibility to other malignant neoplasm: Secondary | ICD-10-CM | POA: Diagnosis not present

## 2021-07-09 NOTE — Progress Notes (Signed)
Office Visit Note  Patient: Pamela Greene             Date of Birth: 05-May-1966           MRN: [id number]             PCP: Deland Pretty, MD Referring: Deland Pretty, MD Visit Date: 07/13/2021 Occupation: @GUAROCC @  Subjective:  Pain in both hands and feet.   History of Present Illness: Pamela Greene is a 55 y.o. female seen in consultation per request of Dr. Collene Mares.  According the patient her symptoms a started at age 36 with diarrhea and bleeding.  She was diagnosed with ulcerative colitis.  She was treated with prednisone and sulfasalazine for many years and also cortisone enemas.  She states in 1992 as her symptoms got worse and she developed pancolitis she underwent colectomy and a J-pouch.  She did very well for many years.  She states that in 2020 he had a routine colonoscopy which showed some colitis in rectosigmoid region.  At the time she was placed on mesalamine by Dr. Collene Mares.  She states the repeat colonoscopy in 2021 was better.  In the last 2 years she had SEBO x4 which was treated with antibiotics.  About 2 weeks ago she started having stomach cramps and fatigue and mucus in her stool.  She was given antibiotics but the symptoms persist.  Dr. Collene Mares suspected that she may have J-pouch inflammation.  She also gave history of swelling and pain in her hands for at least 2 years.  She is also noticed some changes in her feet but her feet are not painful.  None of her other joints are painful.  She complains of some elbow joint discomfort with repeated motion.  There is no history of SI joint pain.  No history of Achilles tendinitis, Planter fasciitis or iritis.  Her father has psoriasis and her mother has rheumatoid arthritis.  She is gravida 2, para 2, miscarriages 0.  There is no history of blood clots.  Activities of Daily Living:  Patient reports morning stiffness for 20 minutes.   Patient Denies nocturnal pain.  Difficulty dressing/grooming: Denies Difficulty climbing stairs:  Denies Difficulty getting out of chair: Denies Difficulty using hands for taps, buttons, cutlery, and/or writing: Reports  Review of Systems  Constitutional:  Negative for fatigue.  HENT:  Positive for mouth dryness and nose dryness. Negative for mouth sores.   Eyes:  Positive for itching. Negative for pain and dryness.  Respiratory:  Negative for shortness of breath and difficulty breathing.   Cardiovascular:  Negative for chest pain and palpitations.  Gastrointestinal:  Negative for blood in stool, constipation and diarrhea.  Endocrine: Negative for increased urination.  Genitourinary:  Negative for difficulty urinating.  Musculoskeletal:  Positive for joint pain, joint pain, joint swelling and morning stiffness. Negative for myalgias, muscle tenderness and myalgias.  Skin:  Negative for color change, rash, redness and sensitivity to sunlight.  Allergic/Immunologic: Negative for susceptible to infections.  Neurological:  Negative for dizziness, numbness, headaches, memory loss and weakness.  Hematological:  Positive for bruising/bleeding tendency. Negative for swollen glands.  Psychiatric/Behavioral:  Negative for depressed mood, confusion and sleep disturbance. The patient is nervous/anxious.    PMFS History:  Patient Active Problem List   Diagnosis Date Noted   Ulcerative colitis ([place]) 07/13/2021   History of anxiety 07/13/2021   Palpitations 03/17/2021   Palpable abdominal aorta 03/17/2021   Anxiety state, unspecified 10/15/2013    Past Medical  History:  Diagnosis Date   Colitis    History of anxiety    Lynch syndrome    Palpitations     Family History  Problem Relation Age of Onset   Osteoarthritis Mother    Rheum arthritis Mother    Atrial fibrillation Mother    Hypertension Father    Breast cancer Paternal 35    Breast cancer Paternal 2    ADD / ADHD Son    Healthy Son    Healthy Daughter    ADD / ADHD Daughter    Past Surgical History:  Procedure  Laterality Date   ABDOMINAL HYSTERECTOMY  2019   ANAL SPHINCTER PROSTHESIS PLACEMENT     J pouch   AUGMENTATION MAMMAPLASTY Bilateral    Social History   Social History Narrative   Not on file   Immunization History  Administered Date(s) Administered   PFIZER(Purple Top)SARS-COV-2 Vaccination 01/26/2020, 02/16/2020     Objective: Vital Signs: BP 125/77 (BP Location: Right Arm, Patient Position: Sitting, Cuff Size: Normal)   Pulse 76   Ht 5' 3.5" (1.613 m)   Wt 129 lb 6.4 oz (58.7 kg)   LMP 11/07/2014   BMI 22.56 kg/m    Physical Exam Vitals and nursing note reviewed.  Constitutional:      Appearance: She is well-developed.  HENT:     Head: Normocephalic and atraumatic.  Eyes:     Conjunctiva/sclera: Conjunctivae normal.  Cardiovascular:     Rate and Rhythm: Normal rate and regular rhythm.     Heart sounds: Normal heart sounds.  Pulmonary:     Effort: Pulmonary effort is normal.     Breath sounds: Normal breath sounds.  Abdominal:     General: Bowel sounds are normal.     Palpations: Abdomen is soft.  Musculoskeletal:     Cervical back: Normal range of motion.  Lymphadenopathy:     Cervical: No cervical adenopathy.  Skin:    General: Skin is warm and dry.     Capillary Refill: Capillary refill takes less than 2 seconds.  Neurological:     Mental Status: She is alert and oriented to person, place, and time.  Psychiatric:        Behavior: Behavior normal.     Musculoskeletal Exam: C-spine, thoracic and lumbar spine were in good range of motion.  She had no SI joint tenderness.  Shoulder joints, elbow joints, wrist joints with good range of motion.  She had bilateral PIP and DIP thickening with no synovitis.  She had tenderness on palpation of her PIP and DIP joints.  There was no swelling or synovitis over MCPs or wrist joints.  Hip joints and knee joints in good range of motion.  She had no tenderness and swelling over ankle joints.  She has bilateral first MTP  thickening.  No PIP and DIP thickening was noted.  CDAI Exam: CDAI Score: -- Patient Global: --; Provider Global: -- Swollen: --; Tender: -- Joint Exam 07/13/2021   No joint exam has been documented for this visit   There is currently no information documented on the homunculus. Go to the Rheumatology activity and complete the homunculus joint exam.  Investigation: No additional findings.  Imaging: XR Foot 2 Views Left  Result Date: 07/13/2021 First MTP, PIP and DIP narrowing was noted.  No other MTPs, intertarsal, tibiotalar or subtalar joint space narrowing was noted.  No erosive changes were noted. Impression: These findings are consistent with osteoarthritis of the foot.  XR Foot 2  Views Right  Result Date: 07/13/2021 First MTP, PIP and DIP narrowing was noted.  No other MTPs, intertarsal, tibiotalar or subtalar joint space narrowing was noted.  No erosive changes were noted. Impression: These findings are consistent with osteoarthritis of the foot.  XR Hand 2 View Left  Result Date: 07/13/2021 CMC, PIP and DIP narrowing was noted.  Severe narrowing of the all DIP joints with erosive changes in the first second and fourth DIPs was noted.  No MCP, intercarpal or radiocarpal joint space narrowing was noted. Impression: These findings are consistent with severe osteoarthritis of the hand.  XR Hand 2 View Right  Result Date: 07/13/2021 CMC, PIP and DIP narrowing was noted.  Severe narrowing of all DIP joints with erosive changes in the second DIP joint was noted.  No MCP, radiocarpal or intercarpal narrowing was noted. Impression: These findings are consistent with osteoarthritis of the hand.   Recent Labs: Lab Results  Component Value Date   WBC 6.3 03/06/2021   HGB 13.1 03/06/2021   PLT 275 03/06/2021   NA 137 03/06/2021   K 3.7 03/06/2021   CL 104 03/06/2021   CO2 24 03/06/2021   GLUCOSE 90 03/06/2021   BUN 6 03/06/2021   CREATININE 0.78 03/06/2021   BILITOT 0.2 (L)  03/06/2021   ALKPHOS 65 03/06/2021   AST 19 03/06/2021   ALT 14 03/06/2021   PROT 7.0 03/06/2021   ALBUMIN 4.5 03/06/2021   CALCIUM 9.4 03/06/2021    August 30 06/2021 labs done at Dr. Lorie Apley office: ESR 7, CRP 3, RF negative, anti-CCP negative, CMP normal except calcium 10.6 CBC normal Speciality Comments: No specialty comments available.  Procedures:  No procedures performed Allergies: Patient has no known allergies.   Assessment / Plan:     Visit Diagnoses: Pain in both hands -she complains of pain and discomfort in her bilateral hands for at least last 2 years.  She notices swelling in her hands which she describes over the DIP and PIP joints.  She has had mucinous cyst removed from her right second DIP joint in the past.  DIP and PIP thickening with mucinous cysts were noted today.  No synovitis was noted.  No nail pitting was noted.  Plan: XR Hand 2 View Right, XR Hand 2 View Left.  X-rays showed severe osteoarthritis with some erosive changes in the DIP joints.  X-ray findings were discussed with the patient.  Joint protection and muscle strengthening was discussed.  Some of the exercises were demonstrated in the office.  A handout on exercises was given.  I advised patient to return in case she develops any increased swelling.  Pain in both feet -she complains of discomfort in her feet.  She had bilateral first MTP prominence without any synovitis.  Plan: XR Foot 2 Views Right, XR Foot 2 Views Left.  X-rays were consistent with bilateral first MTP arthritis.  X-ray findings were discussed with the patient.  Exercises and proper fitting shoes were advised.  Ulcerative rectosigmoiditis with other complication (HCC)-patient had symptoms of ulcerative colitis since she was 55 years old.  She was treated with prednisone and sulfasalazine for many years.  She had J-pouch and colectomy in 1992 and stayed asymptomatic for many years.  She developed rectosigmoid colitis in 2020 and had been  treated with mesalamine.  According to the patient colonoscopy in 2021 was better per patient.  She has been been having some abdominal cramps which have been treated for most likely SEBO x4 in  the last 2 years per patient.  She is followed by Dr. Collene Mares closely.  Palpitations-patient takes propranolol occasionally.  History of anxiety-her symptoms are controlled with trazodone and Effexor.  Palpable abdominal aorta  Family history of psoriasis in father  Family history of rheumatoid arthritis - mother  Orders: Orders Placed This Encounter  Procedures   XR Hand 2 View Right   XR Hand 2 View Left   XR Foot 2 Views Right   XR Foot 2 Views Left    No orders of the defined types were placed in this encounter.   Face-to-face time spent patient was 45 minutes.  More than 50% time was spent in counseling and coordination of care.  Follow-Up Instructions: Return if symptoms worsen or fail to improve, for Osteoarthritis.   Bo Merino, MD  Note - This record has been created using Editor, commissioning.  Chart creation errors have been sought, but may not always  have been located. Such creation errors do not reflect on  the standard of medical care.

## 2021-07-13 ENCOUNTER — Ambulatory Visit: Payer: Self-pay

## 2021-07-13 ENCOUNTER — Encounter: Payer: Self-pay | Admitting: Rheumatology

## 2021-07-13 ENCOUNTER — Ambulatory Visit: Payer: BC Managed Care – PPO | Admitting: Rheumatology

## 2021-07-13 ENCOUNTER — Other Ambulatory Visit: Payer: Self-pay

## 2021-07-13 VITALS — BP 125/77 | HR 76 | Ht 63.5 in | Wt 129.4 lb

## 2021-07-13 DIAGNOSIS — M79671 Pain in right foot: Secondary | ICD-10-CM | POA: Diagnosis not present

## 2021-07-13 DIAGNOSIS — Z840 Family history of diseases of the skin and subcutaneous tissue: Secondary | ICD-10-CM

## 2021-07-13 DIAGNOSIS — R0989 Other specified symptoms and signs involving the circulatory and respiratory systems: Secondary | ICD-10-CM

## 2021-07-13 DIAGNOSIS — K5190 Ulcerative colitis, unspecified, without complications: Secondary | ICD-10-CM | POA: Insufficient documentation

## 2021-07-13 DIAGNOSIS — M79641 Pain in right hand: Secondary | ICD-10-CM

## 2021-07-13 DIAGNOSIS — Z8261 Family history of arthritis: Secondary | ICD-10-CM

## 2021-07-13 DIAGNOSIS — Z8659 Personal history of other mental and behavioral disorders: Secondary | ICD-10-CM | POA: Insufficient documentation

## 2021-07-13 DIAGNOSIS — M79672 Pain in left foot: Secondary | ICD-10-CM | POA: Diagnosis not present

## 2021-07-13 DIAGNOSIS — M2550 Pain in unspecified joint: Secondary | ICD-10-CM

## 2021-07-13 DIAGNOSIS — M79642 Pain in left hand: Secondary | ICD-10-CM

## 2021-07-13 DIAGNOSIS — R002 Palpitations: Secondary | ICD-10-CM | POA: Diagnosis not present

## 2021-07-13 DIAGNOSIS — K51318 Ulcerative (chronic) rectosigmoiditis with other complication: Secondary | ICD-10-CM | POA: Diagnosis not present

## 2021-07-13 NOTE — Patient Instructions (Signed)
Hand Exercises Hand exercises can be helpful for almost anyone. These exercises can strengthen the hands, improve flexibility and movement, and increase blood flow to the hands. These results can make work and daily tasks easier. Hand exercises can be especially helpful for people who have joint pain from arthritis or have nerve damage from overuse (carpal tunnel syndrome). These exercises can also help people who have injured a hand. Exercises Most of these hand exercises are gentle stretching and motion exercises. It is usually safe to do them often throughout the day. Warming up your hands before exercise may help to reduce stiffness. You can do this with gentle massage or by placing your hands in warm water for 10-15 minutes. It is normal to feel some stretching, pulling, tightness, or mild discomfort as you begin new exercises. This will gradually improve. Stop an exercise right away if you feel sudden, severe pain or your pain gets worse. Ask your health care provider which exercises are best for you. Knuckle bend or "claw" fist  Stand or sit with your arm, hand, and all five fingers pointed straight up. Make sure to keep your wrist straight during the exercise. Gently bend your fingers down toward your palm until the tips of your fingers are touching the top of your palm. Keep your big knuckle straight and just bend the small knuckles in your fingers. Hold this position for __________ seconds. Straighten (extend) your fingers back to the starting position. Repeat this exercise 5-10 times with each hand. Full finger fist  Stand or sit with your arm, hand, and all five fingers pointed straight up. Make sure to keep your wrist straight during the exercise. Gently bend your fingers into your palm until the tips of your fingers are touching the middle of your palm. Hold this position for __________ seconds. Extend your fingers back to the starting position, stretching every joint fully. Repeat  this exercise 5-10 times with each hand. Straight fist Stand or sit with your arm, hand, and all five fingers pointed straight up. Make sure to keep your wrist straight during the exercise. Gently bend your fingers at the big knuckle, where your fingers meet your hand, and the middle knuckle. Keep the knuckle at the tips of your fingers straight and try to touch the bottom of your palm. Hold this position for __________ seconds. Extend your fingers back to the starting position, stretching every joint fully. Repeat this exercise 5-10 times with each hand. Tabletop  Stand or sit with your arm, hand, and all five fingers pointed straight up. Make sure to keep your wrist straight during the exercise. Gently bend your fingers at the big knuckle, where your fingers meet your hand, as far down as you can while keeping the small knuckles in your fingers straight. Think of forming a tabletop with your fingers. Hold this position for __________ seconds. Extend your fingers back to the starting position, stretching every joint fully. Repeat this exercise 5-10 times with each hand. Finger spread  Place your hand flat on a table with your palm facing down. Make sure your wrist stays straight as you do this exercise. Spread your fingers and thumb apart from each other as far as you can until you feel a gentle stretch. Hold this position for __________ seconds. Bring your fingers and thumb tight together again. Hold this position for __________ seconds. Repeat this exercise 5-10 times with each hand. Making circles  Stand or sit with your arm, hand, and all five fingers pointed   straight up. Make sure to keep your wrist straight during the exercise. Make a circle by touching the tip of your thumb to the tip of your index finger. Hold for __________ seconds. Then open your hand wide. Repeat this motion with your thumb and each finger on your hand. Repeat this exercise 5-10 times with each hand. Thumb  motion  Sit with your forearm resting on a table and your wrist straight. Your thumb should be facing up toward the ceiling. Keep your fingers relaxed as you move your thumb. Lift your thumb up as high as you can toward the ceiling. Hold for __________ seconds. Bend your thumb across your palm as far as you can, reaching the tip of your thumb for the small finger (pinkie) side of your palm. Hold for __________ seconds. Repeat this exercise 5-10 times with each hand. Grip strengthening  Hold a stress ball or other soft ball in the middle of your hand. Slowly increase the pressure, squeezing the ball as much as you can without causing pain. Think of bringing the tips of your fingers into the middle of your palm. All of your finger joints should bend when doing this exercise. Hold your squeeze for __________ seconds, then relax. Repeat this exercise 5-10 times with each hand. Contact a health care provider if: Your hand pain or discomfort gets much worse when you do an exercise. Your hand pain or discomfort does not improve within 2 hours after you exercise. If you have any of these problems, stop doing these exercises right away. Do not do them again unless your health care provider says that you can. Get help right away if: You develop sudden, severe hand pain or swelling. If this happens, stop doing these exercises right away. Do not do them again unless your health care provider says that you can. This information is not intended to replace advice given to you by your health care provider. Make sure you discuss any questions you have with your health care provider. Document Revised: 02/11/2021 Document Reviewed: 02/11/2021 Elsevier Patient Education  2022 [employer]vier Inc.  

## 2021-07-14 DIAGNOSIS — F4323 Adjustment disorder with mixed anxiety and depressed mood: Secondary | ICD-10-CM | POA: Diagnosis not present

## 2021-07-20 ENCOUNTER — Other Ambulatory Visit: Payer: Self-pay | Admitting: Cardiovascular Disease

## 2021-08-06 DIAGNOSIS — G4733 Obstructive sleep apnea (adult) (pediatric): Secondary | ICD-10-CM | POA: Diagnosis not present

## 2021-08-11 DIAGNOSIS — F4323 Adjustment disorder with mixed anxiety and depressed mood: Secondary | ICD-10-CM | POA: Diagnosis not present

## 2021-08-12 DIAGNOSIS — L570 Actinic keratosis: Secondary | ICD-10-CM | POA: Diagnosis not present

## 2021-08-12 DIAGNOSIS — D2272 Melanocytic nevi of left lower limb, including hip: Secondary | ICD-10-CM | POA: Diagnosis not present

## 2021-08-12 DIAGNOSIS — L821 Other seborrheic keratosis: Secondary | ICD-10-CM | POA: Diagnosis not present

## 2021-08-12 DIAGNOSIS — D2372 Other benign neoplasm of skin of left lower limb, including hip: Secondary | ICD-10-CM | POA: Diagnosis not present

## 2021-08-18 DIAGNOSIS — G4719 Other hypersomnia: Secondary | ICD-10-CM | POA: Diagnosis not present

## 2021-08-18 DIAGNOSIS — G4700 Insomnia, unspecified: Secondary | ICD-10-CM | POA: Diagnosis not present

## 2021-08-18 DIAGNOSIS — G4733 Obstructive sleep apnea (adult) (pediatric): Secondary | ICD-10-CM | POA: Diagnosis not present

## 2021-08-20 DIAGNOSIS — Z0000 Encounter for general adult medical examination without abnormal findings: Secondary | ICD-10-CM | POA: Diagnosis not present

## 2021-08-20 DIAGNOSIS — E559 Vitamin D deficiency, unspecified: Secondary | ICD-10-CM | POA: Diagnosis not present

## 2021-08-20 DIAGNOSIS — E7800 Pure hypercholesterolemia, unspecified: Secondary | ICD-10-CM | POA: Diagnosis not present

## 2021-08-25 DIAGNOSIS — F419 Anxiety disorder, unspecified: Secondary | ICD-10-CM | POA: Diagnosis not present

## 2021-08-25 DIAGNOSIS — E559 Vitamin D deficiency, unspecified: Secondary | ICD-10-CM | POA: Diagnosis not present

## 2021-08-25 DIAGNOSIS — Z0000 Encounter for general adult medical examination without abnormal findings: Secondary | ICD-10-CM | POA: Diagnosis not present

## 2021-08-25 DIAGNOSIS — Z23 Encounter for immunization: Secondary | ICD-10-CM | POA: Diagnosis not present

## 2021-08-25 DIAGNOSIS — K5190 Ulcerative colitis, unspecified, without complications: Secondary | ICD-10-CM | POA: Diagnosis not present

## 2021-08-27 DIAGNOSIS — G4700 Insomnia, unspecified: Secondary | ICD-10-CM | POA: Diagnosis not present

## 2021-08-27 DIAGNOSIS — G4719 Other hypersomnia: Secondary | ICD-10-CM | POA: Diagnosis not present

## 2021-08-27 DIAGNOSIS — G4733 Obstructive sleep apnea (adult) (pediatric): Secondary | ICD-10-CM | POA: Diagnosis not present

## 2021-09-07 ENCOUNTER — Encounter: Payer: Self-pay | Admitting: Cardiovascular Disease

## 2021-09-07 NOTE — Progress Notes (Signed)
Cardiology Office Note:    Date:  09/08/2021   ID:  Pamela Greene, DOB 01-10-1966, MRN [id number]  PCP:  Deland Pretty, MD   [facility] Providers Cardiologist:  Mertie Moores, MD     Referring MD: Deland Pretty, MD   Chief Complaint  Patient presents with   Palpitations     Mar 17, 2021   Pamela Greene is a 55 y.o. female with a hx of palpitations /PVCs We were asked to see her for further evaluation of her PVCs by Dr. Shelia Media.  Has been having difficulty with Vit D levels  Has been diagnosed with OSA Waiting on CPAP   Has had some bacterial overgrowth ,  Started Rifaximin for her small intestine overgorowth   Had multiple " hard heart beats" several weekends ago  ( clinically sound like PVCs) Associated with some dyspena Had a cough associated with it   Is feeling better the past few days   Works as a [employer] )   Nov. 2, 2022 Pamela Greene is seen back for her PVCs, Has OSA, was not able to tolerate the CPAP  Has been referred to the dentist to have an appliance made for her OSA  No palpitations, - her previous episode was likely associated to her bacterial overgrowth ( she has UC and has had a colectomy) Walked 9 holes of golf yesterday  No CP or dyspnea   Past Medical History:  Diagnosis Date   Colitis    History of anxiety    Lynch syndrome    Palpitations     Past Surgical History:  Procedure Laterality Date   ABDOMINAL HYSTERECTOMY  2019   ANAL SPHINCTER PROSTHESIS PLACEMENT     J pouch   AUGMENTATION MAMMAPLASTY Bilateral     Current Medications: Current Meds  Medication Sig   ALPRAZolam (XANAX) 0.25 MG tablet Take 0.25 mg by mouth daily as needed.   magnesium oxide (MAG-OX) 400 (240 Mg) MG tablet Take 1 tablet by mouth daily.   mesalamine (CANASA) 1000 MG suppository 1 suppository   ondansetron (ZOFRAN ODT) 4 MG disintegrating tablet Take 1 tablet (4 mg total) by mouth every 8 (eight) hours as needed for  nausea or vomiting.   propranolol (INDERAL) 10 MG tablet TAKE 1 TABLET BY MOUTH DAILY AS NEEDED FOR PALPITATIONS   traZODone (DESYREL) 50 MG tablet Take 50 mg by mouth at bedtime.   TURMERIC PO Take 1 capsule by mouth daily.   venlafaxine (EFFEXOR) 50 MG tablet Take 50 mg by mouth 2 (two) times daily with a meal.     Allergies:   Patient has no known allergies.   Social History   Socioeconomic History   Marital status: Married    Spouse name: Not on file   Number of children: Not on file   Years of education: Not on file   Highest education level: Not on file  Occupational History   Not on file  Tobacco Use   Smoking status: Never   Smokeless tobacco: Never  Vaping Use   Vaping Use: Never used  Substance and Sexual Activity   Alcohol use: Yes    Comment: occ   Drug use: Never   Sexual activity: Not on file  Other Topics Concern   Not on file  Social History Narrative   Not on file   Social Determinants of Health   Financial Resource Strain: Not on file  Food Insecurity: Not on file  Transportation Needs:  Not on file  Physical Activity: Not on file  Stress: Not on file  Social Connections: Not on file     Family History: The patient's family history includes ADD / ADHD in her daughter and son; Atrial fibrillation in her mother; Breast cancer in her paternal aunt and paternal grandmother; Healthy in her daughter and son; Hypertension in her father; Osteoarthritis in her mother; Rheum arthritis in her mother.  ROS:   Please see the history of present illness.     All other systems reviewed and are negative.  EKGs/Labs/Other Studies Reviewed:    The following studies were reviewed today:     Recent Labs: 03/06/2021: ALT 14; BUN 6; Creatinine, Ser 0.78; Hemoglobin 13.1; Magnesium 2.0; Platelets 275; Potassium 3.7; Sodium 137; TSH 1.208  Recent Lipid Panel No results found for: CHOL, TRIG, HDL, CHOLHDL, VLDL, LDLCALC, LDLDIRECT   Risk Assessment/Calculations:        Physical Exam:    Physical Exam: Blood pressure 110/70, pulse 82, height 5\' 3"  (1.6 m), weight 127 lb 9.6 oz (57.9 kg), last menstrual period 11/07/2014, SpO2 98 %.  GEN:  Well nourished, well developed in no acute distress HEENT: Normal NECK: No JVD; No carotid bruits LYMPHATICS: No lymphadenopathy CARDIAC: RRR , no murmurs, rubs, gallops RESPIRATORY:  Clear to auscultation without rales, wheezing or rhonchi  ABDOMEN: Soft, non-tender, non-distended MUSCULOSKELETAL:  No edema; No deformity  SKIN: Warm and dry NEUROLOGIC:  Alert and oriented x 3   EKG:   Nov. 2, 2022  NSR , no ST or T wave abn.    ASSESSMENT:    1. Palpitations   2. PVC (premature ventricular contraction)    PLAN:      Palpitations: She was having episodes of frequent premature ventricular contractions during time when she had bacterial overgrowth.  Once infection was cleared the PVCs resolved.  She is not had any further PVCs. Her apple watch is able to record EKGs.  She knows what the PVCs look like.  We had some discussion about the benign nature of PVCs. We will see her on an as-needed basis.    2.  Palpable abdominal aorta: benign.    3.  Obstructive sleep apnea: getting a dental consult to have a appliance made for OSA   4.  Ulcerative colitis:  s/p colectomy .         Medication Adjustments/Labs and Tests Ordered: Current medicines are reviewed at length with the patient today.  Concerns regarding medicines are outlined above.  Orders Placed This Encounter  Procedures   EKG 12-Lead    No orders of the defined types were placed in this encounter.   Patient Instructions  Medication Instructions:  Your physician recommends that you continue on your current medications as directed. Please refer to the Current Medication list given to you today.  *If you need a refill on your cardiac medications before your next appointment, please call your pharmacy*   Lab Work: None If you  have labs (blood work) drawn today and your tests are completely normal, you will receive your results only by: MyChart Message (if you have MyChart) OR A paper copy in the mail If you have any lab test that is abnormal or we need to change your treatment, we will call you to review the results.   Testing/Procedures: None   Follow-Up: At [facility], you and your health needs are our priority.  As part of our continuing mission to provide you with exceptional heart care,  we have created designated Provider Care Teams.  These Care Teams include your primary Cardiologist (physician) and Advanced Practice Providers (APPs -  Physician Assistants and Nurse Practitioners) who all work together to provide you with the care you need, when you need it.  We recommend signing up for the patient portal called "MyChart".  Sign up information is provided on this After Visit Summary.  MyChart is used to connect with patients for Virtual Visits (Telemedicine).  Patients are able to view lab/test results, encounter notes, upcoming appointments, etc.  Non-urgent messages can be sent to your provider as well.   To learn more about what you can do with MyChart, go to [email or web address].    Your next appointment:   As needed  The format for your next appointment:   In Person  Provider:   You may see Mertie Moores, MD or one of the following Advanced Practice Providers on your designated Care Team:   Richardson Dopp, PA-C Robbie Lis, [place]   Other Instructions     Signed, Mertie Moores, MD  09/08/2021 8:39 AM    [place]

## 2021-09-08 ENCOUNTER — Other Ambulatory Visit: Payer: Self-pay

## 2021-09-08 ENCOUNTER — Ambulatory Visit: Payer: BC Managed Care – PPO | Admitting: Cardiovascular Disease

## 2021-09-08 ENCOUNTER — Encounter: Payer: Self-pay | Admitting: Cardiovascular Disease

## 2021-09-08 VITALS — BP 110/70 | HR 82 | Ht 63.0 in | Wt 127.6 lb

## 2021-09-08 DIAGNOSIS — I493 Ventricular premature depolarization: Secondary | ICD-10-CM

## 2021-09-08 DIAGNOSIS — R002 Palpitations: Secondary | ICD-10-CM

## 2021-09-08 NOTE — Patient Instructions (Signed)
Medication Instructions:  Your physician recommends that you continue on your current medications as directed. Please refer to the Current Medication list given to you today.  *If you need a refill on your cardiac medications before your next appointment, please call your pharmacy*   Lab Work: None If you have labs (blood work) drawn today and your tests are completely normal, you will receive your results only by: . MyChart Message (if you have MyChart) OR . A paper copy in the mail If you have any lab test that is abnormal or we need to change your treatment, we will call you to review the results.   Testing/Procedures: None   Follow-Up: At [facility], you and your health needs are our priority.  As part of our continuing mission to provide you with exceptional heart care, we have created designated Provider Care Teams.  These Care Teams include your primary Cardiologist (physician) and Advanced Practice Providers (APPs -  Physician Assistants and Nurse Practitioners) who all work together to provide you with the care you need, when you need it.  We recommend signing up for the patient portal called "MyChart".  Sign up information is provided on this After Visit Summary.  MyChart is used to connect with patients for Virtual Visits (Telemedicine).  Patients are able to view lab/test results, encounter notes, upcoming appointments, etc.  Non-urgent messages can be sent to your provider as well.   To learn more about what you can do with MyChart, go to [email or web address].    Your next appointment:   As needed  The format for your next appointment:   In Person  Provider:   You may see Philip Nahser, MD or one of the following Advanced Practice Providers on your designated Care Team:    Scott Weaver, PA-C  Vin Bhagat, PA-C    Other Instructions   

## 2021-09-13 DIAGNOSIS — F4323 Adjustment disorder with mixed anxiety and depressed mood: Secondary | ICD-10-CM | POA: Diagnosis not present

## 2021-09-15 ENCOUNTER — Other Ambulatory Visit: Payer: Self-pay | Admitting: Obstetrics & Gynecology

## 2021-09-15 DIAGNOSIS — Z1231 Encounter for screening mammogram for malignant neoplasm of breast: Secondary | ICD-10-CM

## 2021-10-07 DIAGNOSIS — F4323 Adjustment disorder with mixed anxiety and depressed mood: Secondary | ICD-10-CM | POA: Diagnosis not present

## 2021-10-25 ENCOUNTER — Ambulatory Visit
Admission: RE | Admit: 2021-10-25 | Discharge: 2021-10-25 | Disposition: A | Discharge status: Home / Self Care | Payer: BC Managed Care – PPO | Source: Ambulatory Visit | Attending: Obstetrics & Gynecology | Admitting: Obstetrics & Gynecology

## 2021-10-25 DIAGNOSIS — Z1231 Encounter for screening mammogram for malignant neoplasm of breast: Secondary | ICD-10-CM | POA: Diagnosis not present

## 2021-10-26 DIAGNOSIS — F4323 Adjustment disorder with mixed anxiety and depressed mood: Secondary | ICD-10-CM | POA: Diagnosis not present

## 2021-11-04 ENCOUNTER — Encounter (HOSPITAL_COMMUNITY): Payer: Self-pay

## 2021-11-04 ENCOUNTER — Emergency Department (HOSPITAL_COMMUNITY)
Admission: EM | Admit: 2021-11-04 | Discharge: 2021-11-05 | Disposition: A | Discharge status: Home / Self Care | Payer: BC Managed Care – PPO | Attending: Emergency Medicine | Admitting: Emergency Medicine

## 2021-11-04 ENCOUNTER — Emergency Department (HOSPITAL_COMMUNITY): Payer: BC Managed Care – PPO

## 2021-11-04 DIAGNOSIS — Z9049 Acquired absence of other specified parts of digestive tract: Secondary | ICD-10-CM | POA: Diagnosis not present

## 2021-11-04 DIAGNOSIS — R110 Nausea: Secondary | ICD-10-CM | POA: Diagnosis not present

## 2021-11-04 DIAGNOSIS — K51919 Ulcerative colitis, unspecified with unspecified complications: Secondary | ICD-10-CM | POA: Diagnosis not present

## 2021-11-04 DIAGNOSIS — R1084 Generalized abdominal pain: Secondary | ICD-10-CM | POA: Diagnosis not present

## 2021-11-04 DIAGNOSIS — K5190 Ulcerative colitis, unspecified, without complications: Secondary | ICD-10-CM | POA: Diagnosis not present

## 2021-11-04 DIAGNOSIS — R109 Unspecified abdominal pain: Secondary | ICD-10-CM | POA: Diagnosis not present

## 2021-11-04 LAB — COMPREHENSIVE METABOLIC PANEL
ALT: 21 U/L (ref 0–44)
AST: 24 U/L (ref 15–41)
Albumin: 4.6 g/dL (ref 3.5–5.0)
Alkaline Phosphatase: 64 U/L (ref 38–126)
Anion gap: 8 (ref 5–15)
BUN: 8 mg/dL (ref 6–20)
CO2: 24 mmol/L (ref 22–32)
Calcium: 9.4 mg/dL (ref 8.9–10.3)
Chloride: 100 mmol/L (ref 98–111)
Creatinine, Ser: 0.75 mg/dL (ref 0.44–1.00)
GFR, Estimated: >60 mL/min (ref >60)
Glucose, Bld: 87 mg/dL (ref 70–99)
Potassium: 3.8 mmol/L (ref 3.5–5.1)
Sodium: 132 mmol/L — ABNORMAL LOW (ref 135–145)
Total Bilirubin: 0.6 mg/dL (ref 0.3–1.2)
Total Protein: 7.6 g/dL (ref 6.5–8.1)

## 2021-11-04 LAB — CBC WITH DIFFERENTIAL/PLATELET
Abs Immature Granulocytes: 0.01 10*3/uL (ref 0.00–0.07)
Basophils Absolute: 0 10*3/uL (ref 0.0–0.1)
Basophils Relative: 1 %
Eosinophils Absolute: 0 10*3/uL (ref 0.0–0.5)
Eosinophils Relative: 1 %
HCT: 41.3 % (ref 36.0–46.0)
Hemoglobin: 13.4 g/dL (ref 12.0–15.0)
Immature Granulocytes: 0 %
Lymphocytes Relative: 25 %
Lymphs Abs: 1.1 10*3/uL (ref 0.7–4.0)
MCH: 29.3 pg (ref 26.0–34.0)
MCHC: 32.4 g/dL (ref 30.0–36.0)
MCV: 90.4 fL (ref 80.0–100.0)
Monocytes Absolute: 0.5 10*3/uL (ref 0.1–1.0)
Monocytes Relative: 11 %
Neutro Abs: 2.6 10*3/uL (ref 1.7–7.7)
Neutrophils Relative %: 62 %
Platelets: 266 10*3/uL (ref 150–400)
RBC: 4.57 MIL/uL (ref 3.87–5.11)
RDW: 12.7 % (ref 11.5–15.5)
WBC: 4.3 10*3/uL (ref 4.0–10.5)
nRBC: 0 % (ref 0.0–0.2)

## 2021-11-04 LAB — LIPASE, BLOOD: Lipase: 36 U/L (ref 11–51)

## 2021-11-04 MED ORDER — IOHEXOL 350 MG/ML SOLN
80.0000 mL | Freq: Once | INTRAVENOUS | Status: AC | PRN
  Administered 2021-11-04: 17:00:00 80 mL via INTRAVENOUS

## 2021-11-04 NOTE — ED Provider Notes (Signed)
Emergency Medicine Provider Triage Evaluation Note  Pamela Greene , a 55 y.o. female  was evaluated in triage.  Pt complains of diffuse abdominal pain for the last 5 days radiating around to her back bilaterally with associated nausea and watery diarrhea.  History of ulcerative colitis status post total colectomy.  Review of Systems  Positive: Abdominal pain, nausea, diarrhea Negative: Fevers  Physical Exam  BP (!) 145/89 (BP Location: Left Arm)    Pulse 84    Temp 98.4 F (36.9 C) (Oral)    Resp 18    LMP 11/07/2014    SpO2 98%  Gen:   Awake, no distress   Resp:  Normal effort  MSK:   Moves extremities without difficulty  Other:  Generalized abdominal tenderness to palpation without CVAT.  Medical Decision Making  Medically screening exam initiated at 3:08 PM.  Appropriate orders placed.  BAILLEY GUILFORD was informed that the remainder of the evaluation will be completed by another provider, this initial triage assessment does not replace that evaluation, and the importance of remaining in the ED until their evaluation is complete.  This chart was dictated using voice recognition software, Dragon. Despite the best efforts of this provider to proofread and correct errors, errors may still occur which can change documentation meaning.     Paris Lore, PA-C 11/04/21 1530    Margarita Grizzle, MD 11/04/21 [phone]

## 2021-11-04 NOTE — ED Triage Notes (Signed)
Pt arrived via POV, c/o diffuse abd pain, radiating to back since christmas day. Also endorses nausea and diarrhea. Denies any vomiting.

## 2021-11-05 MED ORDER — PROCHLORPERAZINE MALEATE 10 MG PO TABS: 10.0000 mg | ORAL_TABLET | Freq: Two times a day (BID) | ORAL | 0 refills | Status: AC | PRN

## 2021-11-05 MED ORDER — FENTANYL CITRATE PF 50 MCG/ML IJ SOSY
50.0000 ug | PREFILLED_SYRINGE | Freq: Once | INTRAMUSCULAR | Status: AC
  Administered 2021-11-05: 08:00:00 50 ug via INTRAVENOUS
  Filled 2021-11-05: qty 1

## 2021-11-05 MED ORDER — HYDROCODONE-ACETAMINOPHEN 5-325 MG PO TABS: 1.0000 | ORAL_TABLET | Freq: Four times a day (QID) | ORAL | 0 refills | Status: AC | PRN

## 2021-11-05 MED ORDER — PROCHLORPERAZINE EDISYLATE 10 MG/2ML IJ SOLN
10.0000 mg | Freq: Once | INTRAMUSCULAR | Status: AC
  Administered 2021-11-05: 10:00:00 10 mg via INTRAVENOUS
  Filled 2021-11-05: qty 2

## 2021-11-05 MED ORDER — PREDNISONE 10 MG PO TABS: ORAL_TABLET | ORAL | 0 refills | Status: AC

## 2021-11-05 MED ORDER — ONDANSETRON HCL 4 MG/2ML IJ SOLN
4.0000 mg | Freq: Once | INTRAMUSCULAR | Status: AC
  Administered 2021-11-05: 08:00:00 4 mg via INTRAVENOUS
  Filled 2021-11-05: qty 2

## 2021-11-05 MED ORDER — PREDNISONE 20 MG PO TABS
40.0000 mg | ORAL_TABLET | Freq: Once | ORAL | Status: AC
  Administered 2021-11-05: 08:00:00 40 mg via ORAL
  Filled 2021-11-05: qty 2

## 2021-11-05 MED ORDER — LACTATED RINGERS IV BOLUS
1000.0000 mL | Freq: Once | INTRAVENOUS | Status: AC
  Administered 2021-11-05: 08:00:00 1000 mL via INTRAVENOUS

## 2021-11-05 NOTE — ED Provider Notes (Signed)
[place] EMERGENCY DEPARTMENT Provider Note  CSN: [id number] Arrival date & time: 11/04/21 1352    History Chief Complaint  Patient presents with   Abdominal Pain    Pamela Greene is a 55 y.o. female with history of ulcerative colitis, s/p colectomy with J-pouch many years ago, followed by Dr. Collene Mares has also had history of Small Intestine Bacterial Overgrowth for which she has Abx she can take when needed. She reports for about 5 days she has had increased watery diarrhea, sometimes explosive with diffuse cramping abdominal pain, radiating around to her back. She has been taking the SIBO abx without improvement. She has not had a fever but she has had nausea and decreased PO intake.    Past Medical History:  Diagnosis Date   Colitis    History of anxiety    Lynch syndrome    Palpitations     Past Surgical History:  Procedure Laterality Date   ABDOMINAL HYSTERECTOMY  2019   ANAL SPHINCTER PROSTHESIS PLACEMENT     J pouch   AUGMENTATION MAMMAPLASTY Bilateral     Family History  Problem Relation Age of Onset   Osteoarthritis Mother    Rheum arthritis Mother    Atrial fibrillation Mother    Hypertension Father    Healthy Daughter    ADD / ADHD Daughter    Breast cancer Paternal Aunt        79s   Breast cancer Paternal Grandmother        6s   ADD / ADHD Son    Healthy Son     Social History   Tobacco Use   Smoking status: Never   Smokeless tobacco: Never  Vaping Use   Vaping Use: Never used  Substance Use Topics   Alcohol use: Yes    Comment: occ   Drug use: Never     Home Medications Prior to Admission medications   Medication Sig Start Date End Date Taking? Authorizing Provider  HYDROcodone-acetaminophen (NORCO/VICODIN) 5-325 MG tablet Take 1 tablet by mouth every 6 (six) hours as needed for severe pain. 11/05/21  Yes Truddie Hidden, MD  predniSONE (DELTASONE) 10 MG tablet Take 4 tablets (40 mg total) by mouth daily for 4 days, THEN 2  tablets (20 mg total) daily for 4 days, THEN 1 tablet (10 mg total) daily for 4 days, THEN 0.5 tablets (5 mg total) daily for 4 days. 11/05/21 11/21/21 Yes Truddie Hidden, MD  prochlorperazine (COMPAZINE) 10 MG tablet Take 1 tablet (10 mg total) by mouth 2 (two) times daily as needed for nausea or vomiting. 11/05/21  Yes Truddie Hidden, MD  ALPRAZolam Duanne Moron) 0.25 MG tablet Take 0.25 mg by mouth daily as needed. 08/25/21   [provider]  magnesium oxide (MAG-OX) 400 (240 Mg) MG tablet Take 1 tablet by mouth daily. 03/06/21   [provider]  mesalamine (CANASA) 1000 MG suppository 1 suppository    [provider]  ondansetron (ZOFRAN ODT) 4 MG disintegrating tablet Take 1 tablet (4 mg total) by mouth every 8 (eight) hours as needed for nausea or vomiting. 03/06/21   Henderly, Britni A, PA-C  propranolol (INDERAL) 10 MG tablet TAKE 1 TABLET BY MOUTH DAILY AS NEEDED FOR PALPITATIONS 07/20/21   Nahser, Wonda Cheng, MD  traZODone (DESYREL) 50 MG tablet Take 50 mg by mouth at bedtime. 02/26/21   [provider]  TURMERIC PO Take 1 capsule by mouth daily.    [provider]  venlafaxine (  EFFEXOR) 50 MG tablet Take 50 mg by mouth 2 (two) times daily with a meal. 12/30/20   [provider]     Allergies    Patient has no known allergies.   Review of Systems   Review of Systems A comprehensive review of systems was completed and negative except as noted in HPI.    Physical Exam BP 122/80    Pulse 72    Temp 99 F (37.2 C) (Oral)    Resp 16    LMP 11/07/2014    SpO2 99%   Physical Exam Vitals and nursing note reviewed.  Constitutional:      Appearance: Normal appearance.  HENT:     Head: Normocephalic and atraumatic.     Nose: Nose normal.     Mouth/Throat:     Mouth: Mucous membranes are moist.  Eyes:     Extraocular Movements: Extraocular movements intact.     Conjunctiva/sclera: Conjunctivae normal.  Cardiovascular:     Rate and  Rhythm: Normal rate.  Pulmonary:     Effort: Pulmonary effort is normal.     Breath sounds: Normal breath sounds.  Abdominal:     General: Abdomen is flat. Bowel sounds are normal.     Palpations: Abdomen is soft.     Tenderness: There is generalized abdominal tenderness. There is no guarding. Negative signs include Murphy's sign.  Musculoskeletal:        General: No swelling. Normal range of motion.     Cervical back: Neck supple.  Skin:    General: Skin is warm and dry.  Neurological:     General: No focal deficit present.     Mental Status: She is alert.  Psychiatric:        Mood and Affect: Mood normal.     ED Results / Procedures / Treatments   Labs (all labs ordered are listed, but only abnormal results are displayed) Labs Reviewed  COMPREHENSIVE METABOLIC PANEL - Abnormal; Notable for the following components:      Result Value   Sodium 132 (*)    All other components within normal limits  CBC WITH DIFFERENTIAL/PLATELET  LIPASE, BLOOD  URINALYSIS, ROUTINE W REFLEX MICROSCOPIC    EKG None  Radiology CT Abdomen Pelvis W Contrast  Result Date: 11/04/2021 CLINICAL DATA:  Five days of diffuse abdominal pain radiating to the back bilaterally with nausea and watery diarrhea. History of ulcerative colitis status post total colectomy. EXAM: CT ABDOMEN AND PELVIS WITH CONTRAST TECHNIQUE: Multidetector CT imaging of the abdomen and pelvis was performed using the standard protocol following bolus administration of intravenous contrast. CONTRAST:  69mL OMNIPAQUE IOHEXOL 350 MG/ML SOLN COMPARISON:  October 26, 2004 FINDINGS: Lower chest: No acute abnormality. Partially visualized bilateral breast prostheses. Hepatobiliary: Focal fatty infiltration versus altered perfusion (veins of [place]) along the falciform ligament. No suspicious hepatic lesion. Gallbladder is unremarkable. No biliary ductal dilation. Pancreas: No pancreatic ductal dilation or evidence of acute inflammation.  Spleen: Within normal limits. Adrenals/Urinary Tract: Bilateral adrenal glands appear normal. No hydronephrosis. No solid enhancing renal mass. The kidneys demonstrate symmetric enhancement excretion of contrast. Urinary bladder is unremarkable for degree of distension. Stomach/Bowel: No enteric contrast was administered. Mild wall thickening of the gastric antrum which may reflect under distension versus focal gastritis. Surgical changes of prior colectomy with mild inflammation at the pelvic anastomosis. Vascular/Lymphatic: No abdominal aortic aneurysm. No pathologically enlarged abdominal or pelvic lymph nodes. Reproductive: Status post hysterectomy. No adnexal masses. Other: No significant abdominopelvic free fluid.  No pneumoperitoneum. No portal venous gas. Musculoskeletal: No acute or significant osseous findings. IMPRESSION: 1. Surgical changes of prior colectomy with mild inflammation of the bowel along the pelvic anastomosis. 2. Mild wall thickening of the gastric antrum which may reflect under distension versus focal gastritis. Electronically Signed   By: Dahlia Bailiff M.D.   On: 11/04/2021 17:50    Procedures Procedures  Medications Ordered in the ED Medications  iohexol (OMNIPAQUE) 350 MG/ML injection 80 mL (80 mLs Intravenous Contrast Given 11/04/21 1724)  fentaNYL (SUBLIMAZE) injection 50 mcg (50 mcg Intravenous Given 11/05/21 0826)  ondansetron (ZOFRAN) injection 4 mg (4 mg Intravenous Given 11/05/21 0826)  lactated ringers bolus 1,000 mL (1,000 mLs Intravenous New Bag/Given 11/05/21 0826)  predniSONE (DELTASONE) tablet 40 mg (40 mg Oral Given 11/05/21 0827)  prochlorperazine (COMPAZINE) injection 10 mg (10 mg Intravenous Given 11/05/21 0939)     MDM Rules/Calculators/A&P MDM Patient with history of UC, s/p colectomy here with abdominal pain and increased diarrhea. She had labs done in triage, CBC, CMP and Lipase which are normal. CT showed mild inflammation at the surgical  anastomosis. Will give pain/nausea meds and IVF for comfort. Plan discussion with Dr. Collene Mares regarding treatment.   ED Course  I have reviewed the triage vital signs and the nursing notes.  Pertinent labs & imaging results that were available during my care of the patient were reviewed by me and considered in my medical decision making (see chart for details).  Clinical Course as of 11/05/21 1015  Fri Nov 05, 2021  0817 Spoke with Dr. Collene Mares, she recommends a steroid taper (40-20-10-5 each for 4 days) and she will see her in the office next Tuesday.  [CS]  [id number] Patient is feeling better, will d/c with pain, nausea meds and steroid taper per Dr. Lorie Apley recommendations. Return precautions given.  [CS]    Clinical Course User Index [CS] Truddie Hidden, MD    Final Clinical Impression(s) / ED Diagnoses Final diagnoses:  Ulcerative colitis with complication, unspecified location [facility])    Rx / DC Orders ED Discharge Orders          Ordered    HYDROcodone-acetaminophen (NORCO/VICODIN) 5-325 MG tablet  Every 6 hours PRN        11/05/21 1012    prochlorperazine (COMPAZINE) 10 MG tablet  2 times daily PRN        11/05/21 1012    predniSONE (DELTASONE) 10 MG tablet        11/05/21 1012             Truddie Hidden, MD 11/05/21 1015

## 2021-11-05 NOTE — ED Notes (Signed)
Patient aware urine sample is needed.  

## 2021-11-09 DIAGNOSIS — R933 Abnormal findings on diagnostic imaging of other parts of digestive tract: Secondary | ICD-10-CM | POA: Diagnosis not present

## 2021-11-09 DIAGNOSIS — Z1509 Genetic susceptibility to other malignant neoplasm: Secondary | ICD-10-CM | POA: Diagnosis not present

## 2021-11-09 DIAGNOSIS — K5120 Ulcerative (chronic) proctitis without complications: Secondary | ICD-10-CM | POA: Diagnosis not present

## 2021-11-09 DIAGNOSIS — R1033 Periumbilical pain: Secondary | ICD-10-CM | POA: Diagnosis not present

## 2021-11-30 DIAGNOSIS — F4323 Adjustment disorder with mixed anxiety and depressed mood: Secondary | ICD-10-CM | POA: Diagnosis not present

## 2021-12-01 DIAGNOSIS — Z6822 Body mass index (BMI) 22.0-22.9, adult: Secondary | ICD-10-CM | POA: Diagnosis not present

## 2021-12-01 DIAGNOSIS — Z01419 Encounter for gynecological examination (general) (routine) without abnormal findings: Secondary | ICD-10-CM | POA: Diagnosis not present

## 2021-12-15 DIAGNOSIS — F4323 Adjustment disorder with mixed anxiety and depressed mood: Secondary | ICD-10-CM | POA: Diagnosis not present

## 2021-12-20 DIAGNOSIS — R2230 Localized swelling, mass and lump, unspecified upper limb: Secondary | ICD-10-CM | POA: Diagnosis not present

## 2021-12-23 DIAGNOSIS — Z1509 Genetic susceptibility to other malignant neoplasm: Secondary | ICD-10-CM | POA: Diagnosis not present

## 2021-12-23 DIAGNOSIS — Z1211 Encounter for screening for malignant neoplasm of colon: Secondary | ICD-10-CM | POA: Diagnosis not present

## 2021-12-23 DIAGNOSIS — K5120 Ulcerative (chronic) proctitis without complications: Secondary | ICD-10-CM | POA: Diagnosis not present

## 2021-12-23 DIAGNOSIS — K219 Gastro-esophageal reflux disease without esophagitis: Secondary | ICD-10-CM | POA: Diagnosis not present

## 2021-12-30 DIAGNOSIS — F4323 Adjustment disorder with mixed anxiety and depressed mood: Secondary | ICD-10-CM | POA: Diagnosis not present

## 2022-01-12 DIAGNOSIS — F4323 Adjustment disorder with mixed anxiety and depressed mood: Secondary | ICD-10-CM | POA: Diagnosis not present

## 2022-02-08 DIAGNOSIS — F4323 Adjustment disorder with mixed anxiety and depressed mood: Secondary | ICD-10-CM | POA: Diagnosis not present

## 2022-03-09 DIAGNOSIS — F4323 Adjustment disorder with mixed anxiety and depressed mood: Secondary | ICD-10-CM | POA: Diagnosis not present

## 2022-04-13 DIAGNOSIS — F4323 Adjustment disorder with mixed anxiety and depressed mood: Secondary | ICD-10-CM | POA: Diagnosis not present

## 2022-04-15 DIAGNOSIS — K2970 Gastritis, unspecified, without bleeding: Secondary | ICD-10-CM | POA: Diagnosis not present

## 2022-04-15 DIAGNOSIS — Z1509 Genetic susceptibility to other malignant neoplasm: Secondary | ICD-10-CM | POA: Diagnosis not present

## 2022-04-15 DIAGNOSIS — K219 Gastro-esophageal reflux disease without esophagitis: Secondary | ICD-10-CM | POA: Diagnosis not present

## 2022-04-15 DIAGNOSIS — K5120 Ulcerative (chronic) proctitis without complications: Secondary | ICD-10-CM | POA: Diagnosis not present

## 2022-04-15 DIAGNOSIS — K6389 Other specified diseases of intestine: Secondary | ICD-10-CM | POA: Diagnosis not present

## 2022-06-16 DIAGNOSIS — F4323 Adjustment disorder with mixed anxiety and depressed mood: Secondary | ICD-10-CM | POA: Diagnosis not present

## 2022-06-29 DIAGNOSIS — F4323 Adjustment disorder with mixed anxiety and depressed mood: Secondary | ICD-10-CM | POA: Diagnosis not present

## 2022-07-13 DIAGNOSIS — F4323 Adjustment disorder with mixed anxiety and depressed mood: Secondary | ICD-10-CM | POA: Diagnosis not present

## 2022-08-24 DIAGNOSIS — F4323 Adjustment disorder with mixed anxiety and depressed mood: Secondary | ICD-10-CM | POA: Diagnosis not present

## 2022-08-25 DIAGNOSIS — Z0000 Encounter for general adult medical examination without abnormal findings: Secondary | ICD-10-CM | POA: Diagnosis not present

## 2022-08-25 DIAGNOSIS — E7800 Pure hypercholesterolemia, unspecified: Secondary | ICD-10-CM | POA: Diagnosis not present

## 2022-09-01 DIAGNOSIS — E559 Vitamin D deficiency, unspecified: Secondary | ICD-10-CM | POA: Diagnosis not present

## 2022-09-01 DIAGNOSIS — L821 Other seborrheic keratosis: Secondary | ICD-10-CM | POA: Diagnosis not present

## 2022-09-01 DIAGNOSIS — D2272 Melanocytic nevi of left lower limb, including hip: Secondary | ICD-10-CM | POA: Diagnosis not present

## 2022-09-01 DIAGNOSIS — D2271 Melanocytic nevi of right lower limb, including hip: Secondary | ICD-10-CM | POA: Diagnosis not present

## 2022-09-01 DIAGNOSIS — R5383 Other fatigue: Secondary | ICD-10-CM | POA: Diagnosis not present

## 2022-09-01 DIAGNOSIS — D2262 Melanocytic nevi of left upper limb, including shoulder: Secondary | ICD-10-CM | POA: Diagnosis not present

## 2022-09-01 DIAGNOSIS — M8589 Other specified disorders of bone density and structure, multiple sites: Secondary | ICD-10-CM | POA: Diagnosis not present

## 2022-09-01 DIAGNOSIS — M85851 Other specified disorders of bone density and structure, right thigh: Secondary | ICD-10-CM | POA: Diagnosis not present

## 2022-09-01 DIAGNOSIS — M85852 Other specified disorders of bone density and structure, left thigh: Secondary | ICD-10-CM | POA: Diagnosis not present

## 2022-09-01 DIAGNOSIS — Z23 Encounter for immunization: Secondary | ICD-10-CM | POA: Diagnosis not present

## 2022-09-01 DIAGNOSIS — Z0000 Encounter for general adult medical examination without abnormal findings: Secondary | ICD-10-CM | POA: Diagnosis not present

## 2022-09-07 DIAGNOSIS — F4323 Adjustment disorder with mixed anxiety and depressed mood: Secondary | ICD-10-CM | POA: Diagnosis not present

## 2022-09-12 ENCOUNTER — Other Ambulatory Visit: Payer: Self-pay | Admitting: Obstetrics & Gynecology

## 2022-09-12 DIAGNOSIS — Z1231 Encounter for screening mammogram for malignant neoplasm of breast: Secondary | ICD-10-CM

## 2022-10-12 DIAGNOSIS — F4323 Adjustment disorder with mixed anxiety and depressed mood: Secondary | ICD-10-CM | POA: Diagnosis not present

## 2022-10-21 DIAGNOSIS — H40053 Ocular hypertension, bilateral: Secondary | ICD-10-CM | POA: Diagnosis not present

## 2022-11-09 DIAGNOSIS — F4323 Adjustment disorder with mixed anxiety and depressed mood: Secondary | ICD-10-CM | POA: Diagnosis not present

## 2022-11-10 ENCOUNTER — Ambulatory Visit
Admission: RE | Admit: 2022-11-10 | Discharge: 2022-11-10 | Disposition: A | Discharge status: Home / Self Care | Payer: BC Managed Care – PPO | Source: Ambulatory Visit | Attending: Obstetrics & Gynecology | Admitting: Obstetrics & Gynecology

## 2022-11-10 DIAGNOSIS — Z1231 Encounter for screening mammogram for malignant neoplasm of breast: Secondary | ICD-10-CM | POA: Diagnosis not present

## 2022-11-23 DIAGNOSIS — F4323 Adjustment disorder with mixed anxiety and depressed mood: Secondary | ICD-10-CM | POA: Diagnosis not present

## 2022-11-30 DIAGNOSIS — Z636 Dependent relative needing care at home: Secondary | ICD-10-CM | POA: Diagnosis not present

## 2022-11-30 DIAGNOSIS — F329 Major depressive disorder, single episode, unspecified: Secondary | ICD-10-CM | POA: Diagnosis not present

## 2022-12-07 DIAGNOSIS — F4323 Adjustment disorder with mixed anxiety and depressed mood: Secondary | ICD-10-CM | POA: Diagnosis not present

## 2022-12-13 DIAGNOSIS — Z6823 Body mass index (BMI) 23.0-23.9, adult: Secondary | ICD-10-CM | POA: Diagnosis not present

## 2022-12-13 DIAGNOSIS — Z01419 Encounter for gynecological examination (general) (routine) without abnormal findings: Secondary | ICD-10-CM | POA: Diagnosis not present

## 2022-12-20 DIAGNOSIS — F419 Anxiety disorder, unspecified: Secondary | ICD-10-CM | POA: Diagnosis not present

## 2022-12-20 DIAGNOSIS — F439 Reaction to severe stress, unspecified: Secondary | ICD-10-CM | POA: Diagnosis not present

## 2023-01-11 DIAGNOSIS — F4323 Adjustment disorder with mixed anxiety and depressed mood: Secondary | ICD-10-CM | POA: Diagnosis not present

## 2023-02-08 DIAGNOSIS — F4323 Adjustment disorder with mixed anxiety and depressed mood: Secondary | ICD-10-CM | POA: Diagnosis not present

## 2023-02-23 DIAGNOSIS — G4719 Other hypersomnia: Secondary | ICD-10-CM | POA: Diagnosis not present

## 2023-02-23 DIAGNOSIS — G4700 Insomnia, unspecified: Secondary | ICD-10-CM | POA: Diagnosis not present

## 2023-02-23 DIAGNOSIS — G4733 Obstructive sleep apnea (adult) (pediatric): Secondary | ICD-10-CM | POA: Diagnosis not present

## 2023-03-15 DIAGNOSIS — F4323 Adjustment disorder with mixed anxiety and depressed mood: Secondary | ICD-10-CM | POA: Diagnosis not present

## 2023-04-24 DIAGNOSIS — H524 Presbyopia: Secondary | ICD-10-CM | POA: Diagnosis not present

## 2023-04-24 DIAGNOSIS — H40053 Ocular hypertension, bilateral: Secondary | ICD-10-CM | POA: Diagnosis not present

## 2023-04-24 DIAGNOSIS — H5213 Myopia, bilateral: Secondary | ICD-10-CM | POA: Diagnosis not present

## 2023-05-31 DIAGNOSIS — F4323 Adjustment disorder with mixed anxiety and depressed mood: Secondary | ICD-10-CM | POA: Diagnosis not present

## 2023-06-21 DIAGNOSIS — F4323 Adjustment disorder with mixed anxiety and depressed mood: Secondary | ICD-10-CM | POA: Diagnosis not present

## 2023-07-05 DIAGNOSIS — F4323 Adjustment disorder with mixed anxiety and depressed mood: Secondary | ICD-10-CM | POA: Diagnosis not present

## 2023-07-26 DIAGNOSIS — M79671 Pain in right foot: Secondary | ICD-10-CM | POA: Diagnosis not present

## 2023-08-02 DIAGNOSIS — F4323 Adjustment disorder with mixed anxiety and depressed mood: Secondary | ICD-10-CM | POA: Diagnosis not present

## 2023-08-31 DIAGNOSIS — E559 Vitamin D deficiency, unspecified: Secondary | ICD-10-CM | POA: Diagnosis not present

## 2023-08-31 DIAGNOSIS — E7800 Pure hypercholesterolemia, unspecified: Secondary | ICD-10-CM | POA: Diagnosis not present

## 2023-08-31 DIAGNOSIS — Z0000 Encounter for general adult medical examination without abnormal findings: Secondary | ICD-10-CM | POA: Diagnosis not present

## 2023-08-31 DIAGNOSIS — R5383 Other fatigue: Secondary | ICD-10-CM | POA: Diagnosis not present

## 2023-08-31 DIAGNOSIS — R319 Hematuria, unspecified: Secondary | ICD-10-CM | POA: Diagnosis not present

## 2023-09-04 DIAGNOSIS — L819 Disorder of pigmentation, unspecified: Secondary | ICD-10-CM | POA: Diagnosis not present

## 2023-09-04 DIAGNOSIS — L570 Actinic keratosis: Secondary | ICD-10-CM | POA: Diagnosis not present

## 2023-09-04 DIAGNOSIS — D2262 Melanocytic nevi of left upper limb, including shoulder: Secondary | ICD-10-CM | POA: Diagnosis not present

## 2023-09-04 DIAGNOSIS — L821 Other seborrheic keratosis: Secondary | ICD-10-CM | POA: Diagnosis not present

## 2023-09-04 DIAGNOSIS — D2272 Melanocytic nevi of left lower limb, including hip: Secondary | ICD-10-CM | POA: Diagnosis not present

## 2023-09-06 DIAGNOSIS — Z23 Encounter for immunization: Secondary | ICD-10-CM | POA: Diagnosis not present

## 2023-09-06 DIAGNOSIS — F419 Anxiety disorder, unspecified: Secondary | ICD-10-CM | POA: Diagnosis not present

## 2023-09-06 DIAGNOSIS — Z0000 Encounter for general adult medical examination without abnormal findings: Secondary | ICD-10-CM | POA: Diagnosis not present

## 2023-09-06 DIAGNOSIS — E7800 Pure hypercholesterolemia, unspecified: Secondary | ICD-10-CM | POA: Diagnosis not present

## 2023-10-13 ENCOUNTER — Other Ambulatory Visit: Payer: Self-pay | Admitting: Internal Medicine

## 2023-10-13 DIAGNOSIS — Z1231 Encounter for screening mammogram for malignant neoplasm of breast: Secondary | ICD-10-CM

## 2023-10-21 IMAGING — MG DIGITAL SCREENING BREAST BILAT IMPLANT W/ TOMO W/ CAD
9 of 12 series · 9 of 28 positions shown · non-contrast
Comparison: Previous exam(s).

CLINICAL DATA: Screening.

EXAM:
DIGITAL SCREENING BILATERAL MAMMOGRAM WITH IMPLANTS, CAD AND
TOMOSYNTHESIS
TECHNIQUE: Bilateral screening digital craniocaudal and mediolateral oblique
mammograms were obtained. Bilateral screening digital breast
tomosynthesis was performed. The images were evaluated with
computer-aided detection. Standard and/or implant displaced views
were performed.

[R MLO]
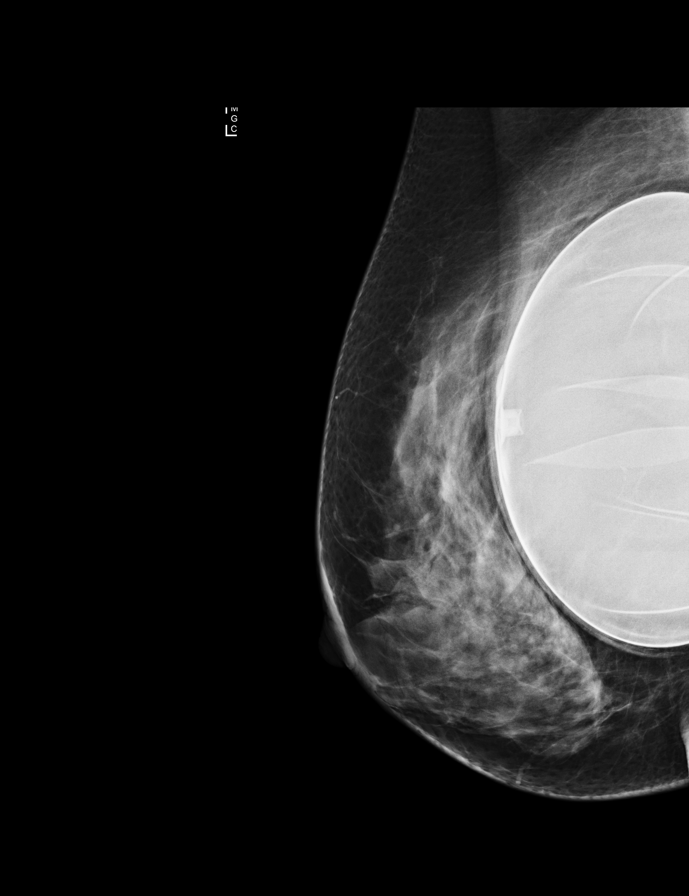

[R CC]
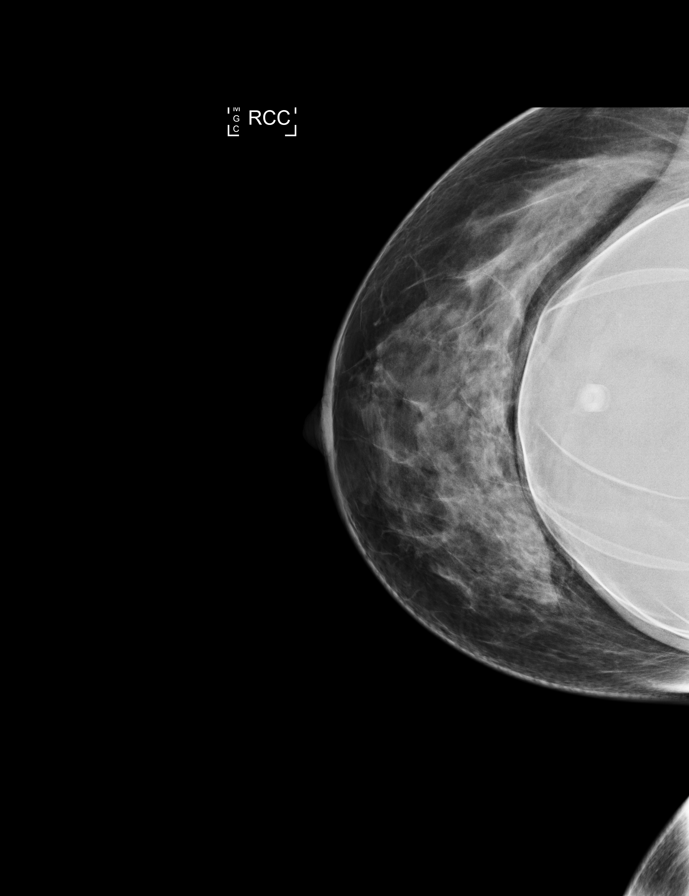

[L MLO]
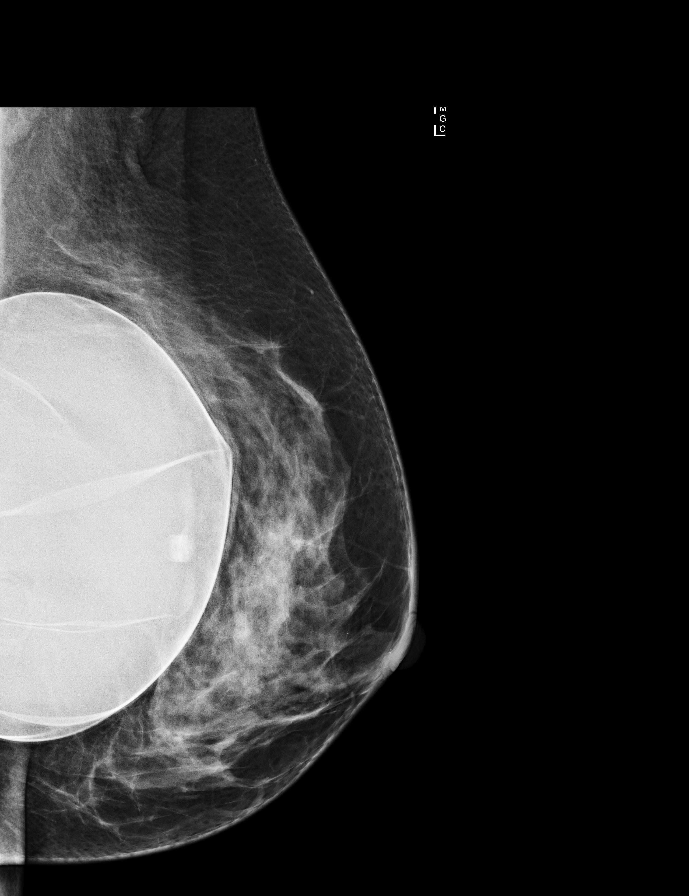

[L CC]
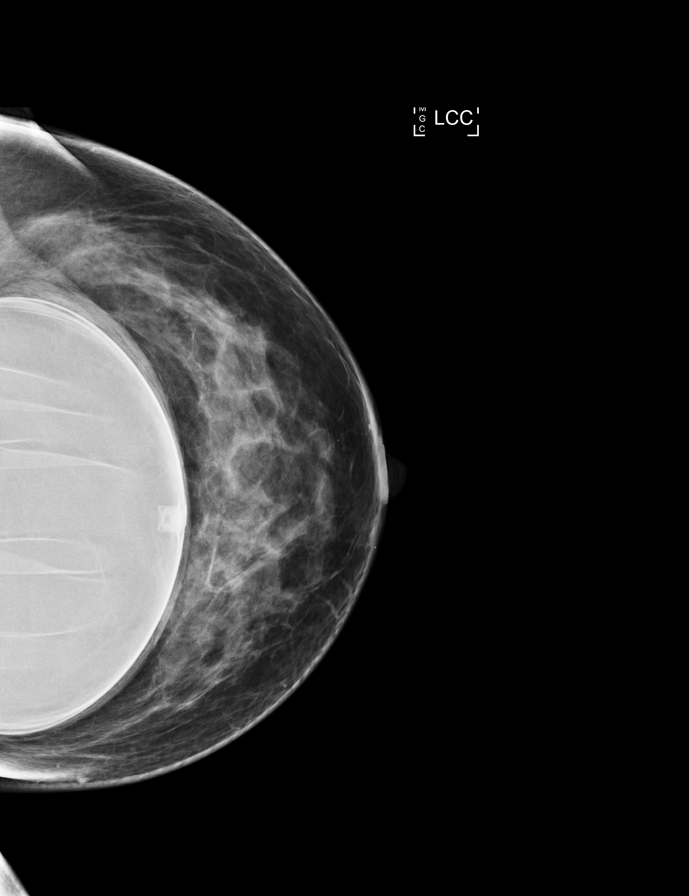

[L CC synth-2D]
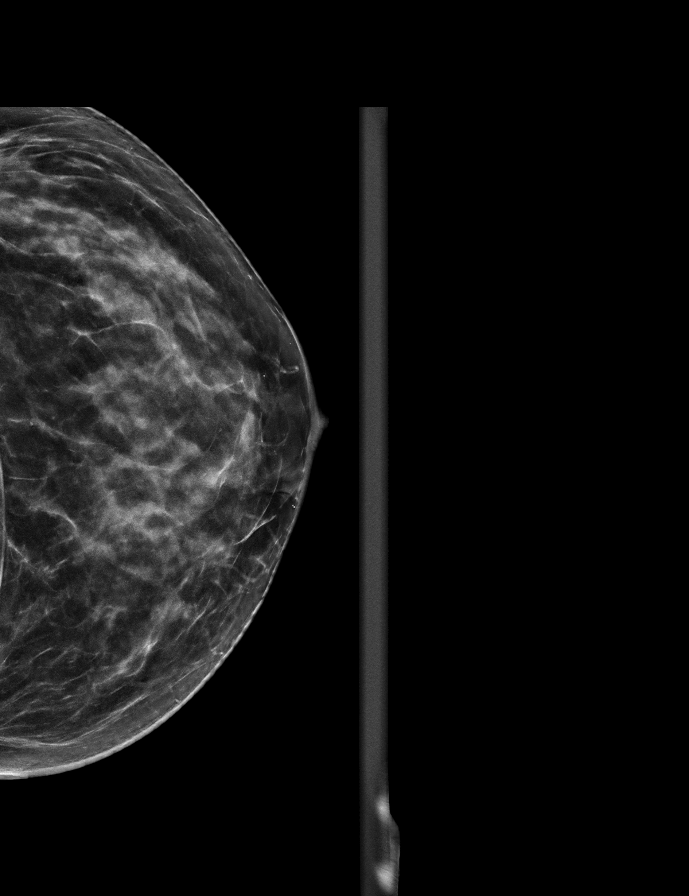

[R MLO synth-2D]
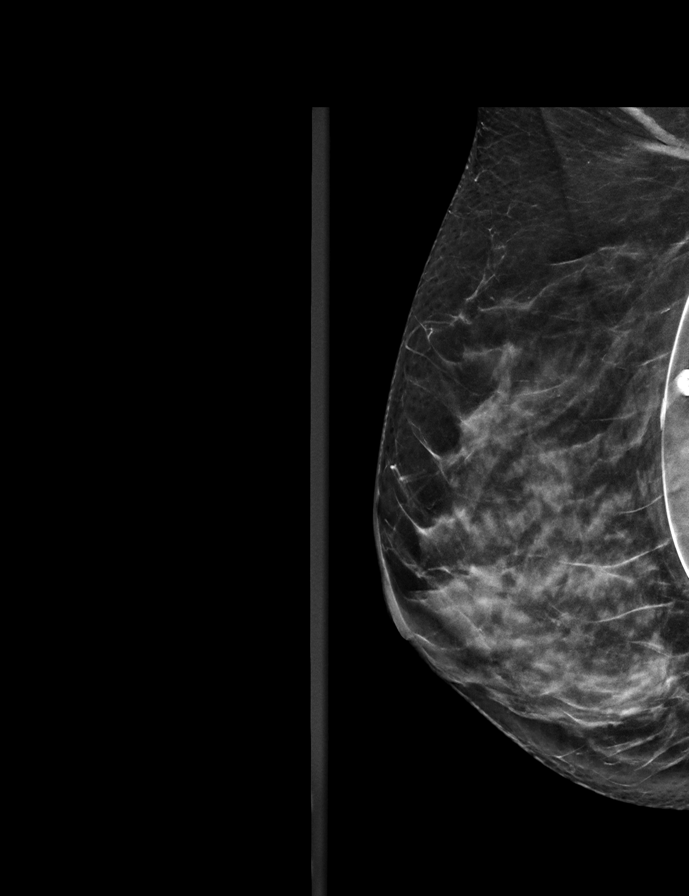

[L MLO synth-2D]
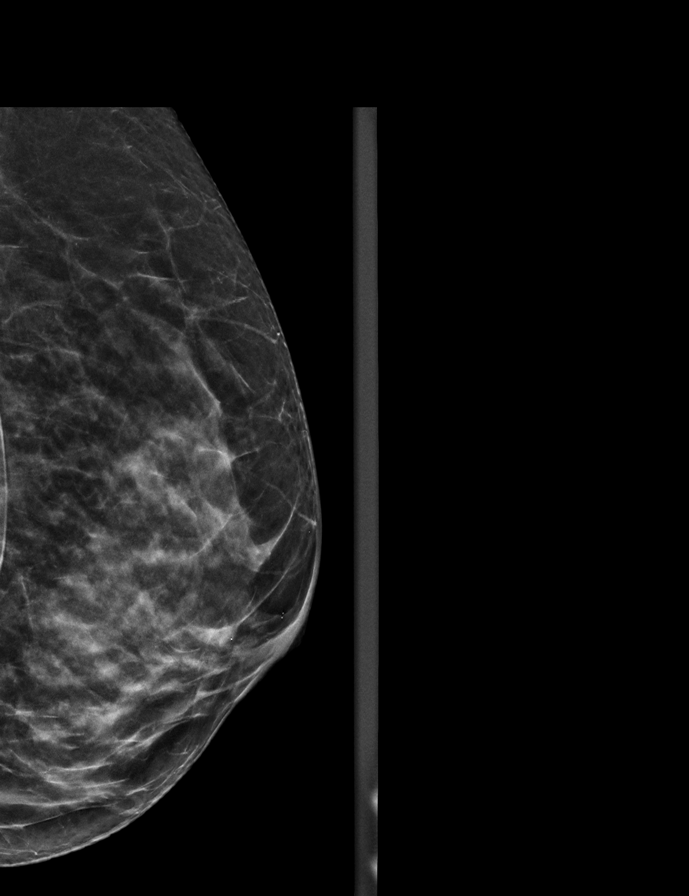

[R CC synth-2D]
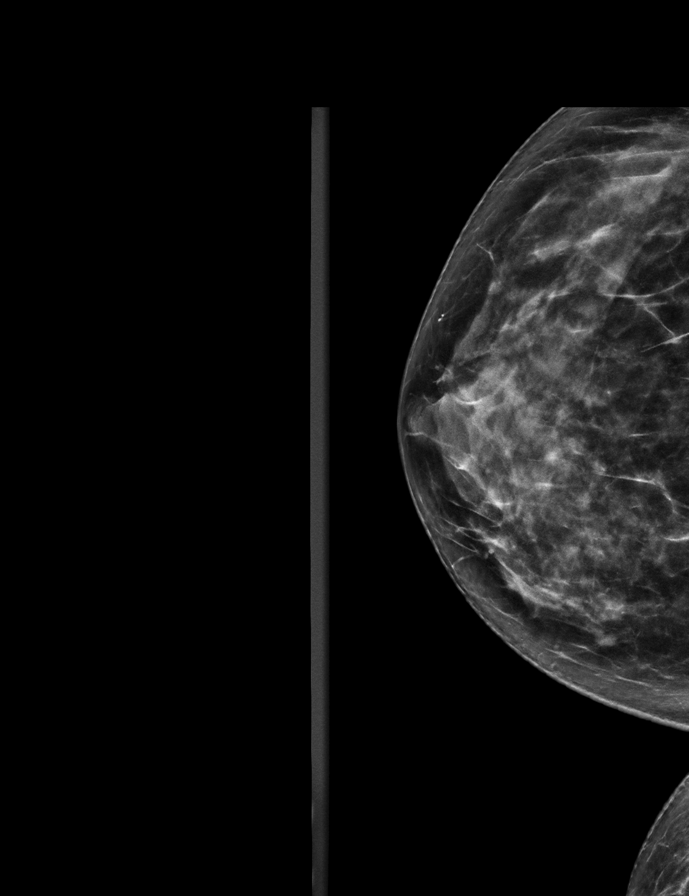

[L MLOID BREAST TOMOSYNTHESIS IMAGE tomo · tomo slice 26/51.0]
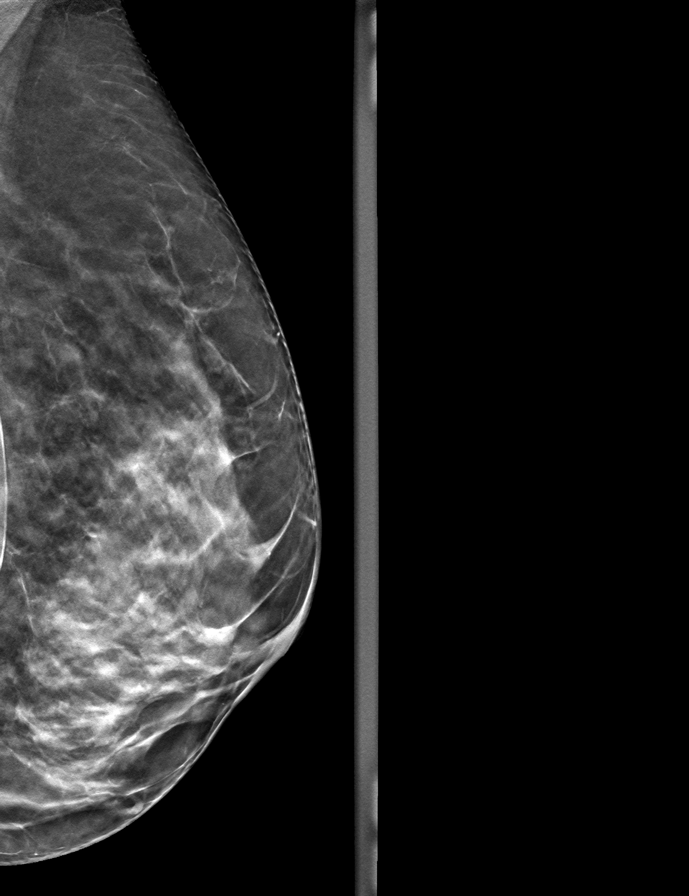

[9 of 28 positions shown; findings below may reference images not displayed]

ACR Breast Density Category c: The breast tissue is heterogeneously
dense, which may obscure small masses.
FINDINGS: The patient has retropectoral saline implants. There are no findings
suspicious for malignancy.
IMPRESSION: No mammographic evidence of malignancy. A result letter of this
screening mammogram will be mailed directly to the patient.

RECOMMENDATION:
Screening mammogram in one year. (Code:FQ-5-EOL)

BI-RADS CATEGORY  1:  Negative.

## 2023-11-13 DIAGNOSIS — Z1231 Encounter for screening mammogram for malignant neoplasm of breast: Secondary | ICD-10-CM

## 2023-11-29 ENCOUNTER — Ambulatory Visit
Admission: RE | Admit: 2023-11-29 | Discharge: 2023-11-29 | Disposition: A | Discharge status: Home / Self Care | Payer: BC Managed Care – PPO | Source: Ambulatory Visit | Attending: Internal Medicine | Admitting: Internal Medicine

## 2023-11-29 DIAGNOSIS — Z1231 Encounter for screening mammogram for malignant neoplasm of breast: Secondary | ICD-10-CM

## 2023-12-15 DIAGNOSIS — Z01419 Encounter for gynecological examination (general) (routine) without abnormal findings: Secondary | ICD-10-CM | POA: Diagnosis not present

## 2023-12-15 DIAGNOSIS — Z6823 Body mass index (BMI) 23.0-23.9, adult: Secondary | ICD-10-CM | POA: Diagnosis not present

## 2023-12-15 DIAGNOSIS — Z1151 Encounter for screening for human papillomavirus (HPV): Secondary | ICD-10-CM | POA: Diagnosis not present

## 2023-12-19 ENCOUNTER — Other Ambulatory Visit (HOSPITAL_COMMUNITY): Payer: Self-pay | Admitting: Gastroenterology

## 2023-12-19 DIAGNOSIS — Z1509 Genetic susceptibility to other malignant neoplasm: Secondary | ICD-10-CM | POA: Diagnosis not present

## 2023-12-19 DIAGNOSIS — K5120 Ulcerative (chronic) proctitis without complications: Secondary | ICD-10-CM | POA: Diagnosis not present

## 2023-12-19 DIAGNOSIS — R1033 Periumbilical pain: Secondary | ICD-10-CM | POA: Diagnosis not present

## 2023-12-19 DIAGNOSIS — K219 Gastro-esophageal reflux disease without esophagitis: Secondary | ICD-10-CM | POA: Diagnosis not present

## 2023-12-25 ENCOUNTER — Ambulatory Visit (HOSPITAL_COMMUNITY)
Admission: RE | Admit: 2023-12-25 | Discharge: 2023-12-25 | Disposition: A | Discharge status: Home / Self Care | Payer: BC Managed Care – PPO | Source: Ambulatory Visit | Attending: Gastroenterology | Admitting: Gastroenterology

## 2023-12-25 DIAGNOSIS — Z90710 Acquired absence of both cervix and uterus: Secondary | ICD-10-CM | POA: Diagnosis not present

## 2023-12-25 DIAGNOSIS — R1033 Periumbilical pain: Secondary | ICD-10-CM | POA: Insufficient documentation

## 2023-12-25 MED ORDER — IOHEXOL 350 MG/ML SOLN
75.0000 mL | Freq: Once | INTRAVENOUS | Status: AC | PRN
  Administered 2023-12-25: 75 mL via INTRAVENOUS

## 2024-01-09 DIAGNOSIS — Z13820 Encounter for screening for osteoporosis: Secondary | ICD-10-CM | POA: Diagnosis not present

## 2024-02-14 DIAGNOSIS — H93293 Other abnormal auditory perceptions, bilateral: Secondary | ICD-10-CM | POA: Diagnosis not present

## 2024-03-14 DIAGNOSIS — Z1509 Genetic susceptibility to other malignant neoplasm: Secondary | ICD-10-CM | POA: Diagnosis not present

## 2024-03-14 DIAGNOSIS — K5120 Ulcerative (chronic) proctitis without complications: Secondary | ICD-10-CM | POA: Diagnosis not present

## 2024-03-14 DIAGNOSIS — Z1211 Encounter for screening for malignant neoplasm of colon: Secondary | ICD-10-CM | POA: Diagnosis not present

## 2024-03-14 DIAGNOSIS — K219 Gastro-esophageal reflux disease without esophagitis: Secondary | ICD-10-CM | POA: Diagnosis not present

## 2024-04-22 DIAGNOSIS — Z1509 Genetic susceptibility to other malignant neoplasm: Secondary | ICD-10-CM | POA: Diagnosis not present

## 2024-04-22 DIAGNOSIS — K5190 Ulcerative colitis, unspecified, without complications: Secondary | ICD-10-CM | POA: Diagnosis not present

## 2024-04-22 DIAGNOSIS — K2950 Unspecified chronic gastritis without bleeding: Secondary | ICD-10-CM | POA: Diagnosis not present

## 2024-04-22 DIAGNOSIS — Z1211 Encounter for screening for malignant neoplasm of colon: Secondary | ICD-10-CM | POA: Diagnosis not present

## 2024-04-22 DIAGNOSIS — K319 Disease of stomach and duodenum, unspecified: Secondary | ICD-10-CM | POA: Diagnosis not present

## 2024-04-22 DIAGNOSIS — K3189 Other diseases of stomach and duodenum: Secondary | ICD-10-CM | POA: Diagnosis not present

## 2024-04-22 DIAGNOSIS — K5120 Ulcerative (chronic) proctitis without complications: Secondary | ICD-10-CM | POA: Diagnosis not present

## 2024-04-22 DIAGNOSIS — K6289 Other specified diseases of anus and rectum: Secondary | ICD-10-CM | POA: Diagnosis not present

## 2024-04-22 DIAGNOSIS — K648 Other hemorrhoids: Secondary | ICD-10-CM | POA: Diagnosis not present

## 2024-04-22 DIAGNOSIS — K2970 Gastritis, unspecified, without bleeding: Secondary | ICD-10-CM | POA: Diagnosis not present

## 2024-05-14 DIAGNOSIS — F4323 Adjustment disorder with mixed anxiety and depressed mood: Secondary | ICD-10-CM | POA: Diagnosis not present

## 2024-05-28 DIAGNOSIS — K5190 Ulcerative colitis, unspecified, without complications: Secondary | ICD-10-CM | POA: Diagnosis not present

## 2024-05-28 DIAGNOSIS — F4323 Adjustment disorder with mixed anxiety and depressed mood: Secondary | ICD-10-CM | POA: Diagnosis not present

## 2024-05-28 DIAGNOSIS — K5120 Ulcerative (chronic) proctitis without complications: Secondary | ICD-10-CM | POA: Diagnosis not present

## 2024-05-28 DIAGNOSIS — R1033 Periumbilical pain: Secondary | ICD-10-CM | POA: Diagnosis not present

## 2024-05-29 ENCOUNTER — Other Ambulatory Visit (HOSPITAL_COMMUNITY): Payer: Self-pay | Admitting: Gastroenterology

## 2024-05-29 DIAGNOSIS — K5120 Ulcerative (chronic) proctitis without complications: Secondary | ICD-10-CM

## 2024-05-30 DIAGNOSIS — D485 Neoplasm of uncertain behavior of skin: Secondary | ICD-10-CM | POA: Diagnosis not present

## 2024-05-30 DIAGNOSIS — L820 Inflamed seborrheic keratosis: Secondary | ICD-10-CM | POA: Diagnosis not present

## 2024-06-06 ENCOUNTER — Ambulatory Visit (HOSPITAL_COMMUNITY)
Admission: RE | Admit: 2024-06-06 | Discharge: 2024-06-06 | Disposition: A | Discharge status: Home / Self Care | Source: Ambulatory Visit | Attending: Gastroenterology | Admitting: Gastroenterology

## 2024-06-06 DIAGNOSIS — K5120 Ulcerative (chronic) proctitis without complications: Secondary | ICD-10-CM | POA: Insufficient documentation

## 2024-06-06 MED ORDER — IOHEXOL 300 MG/ML  SOLN
100.0000 mL | Freq: Once | INTRAMUSCULAR | Status: AC | PRN
  Administered 2024-06-06: 100 mL via INTRAVENOUS

## 2024-06-13 DIAGNOSIS — F4323 Adjustment disorder with mixed anxiety and depressed mood: Secondary | ICD-10-CM | POA: Diagnosis not present

## 2024-06-27 DIAGNOSIS — F4323 Adjustment disorder with mixed anxiety and depressed mood: Secondary | ICD-10-CM | POA: Diagnosis not present

## 2024-07-18 DIAGNOSIS — F4323 Adjustment disorder with mixed anxiety and depressed mood: Secondary | ICD-10-CM | POA: Diagnosis not present

## 2024-08-22 DIAGNOSIS — F4323 Adjustment disorder with mixed anxiety and depressed mood: Secondary | ICD-10-CM | POA: Diagnosis not present

## 2024-09-02 DIAGNOSIS — H5213 Myopia, bilateral: Secondary | ICD-10-CM | POA: Diagnosis not present

## 2024-09-03 DIAGNOSIS — F4323 Adjustment disorder with mixed anxiety and depressed mood: Secondary | ICD-10-CM | POA: Diagnosis not present

## 2024-09-05 DIAGNOSIS — Z0000 Encounter for general adult medical examination without abnormal findings: Secondary | ICD-10-CM | POA: Diagnosis not present

## 2024-09-11 DIAGNOSIS — Z23 Encounter for immunization: Secondary | ICD-10-CM | POA: Diagnosis not present

## 2024-09-11 DIAGNOSIS — E7800 Pure hypercholesterolemia, unspecified: Secondary | ICD-10-CM | POA: Diagnosis not present

## 2024-09-11 DIAGNOSIS — R5383 Other fatigue: Secondary | ICD-10-CM | POA: Diagnosis not present

## 2024-09-11 DIAGNOSIS — Z0000 Encounter for general adult medical examination without abnormal findings: Secondary | ICD-10-CM | POA: Diagnosis not present

## 2024-09-12 DIAGNOSIS — Z0000 Encounter for general adult medical examination without abnormal findings: Secondary | ICD-10-CM | POA: Diagnosis not present

## 2024-09-12 DIAGNOSIS — E673 Hypervitaminosis D: Secondary | ICD-10-CM | POA: Diagnosis not present

## 2024-09-12 DIAGNOSIS — R5383 Other fatigue: Secondary | ICD-10-CM | POA: Diagnosis not present

## 2024-09-12 DIAGNOSIS — E559 Vitamin D deficiency, unspecified: Secondary | ICD-10-CM | POA: Diagnosis not present

## 2024-09-12 DIAGNOSIS — E7800 Pure hypercholesterolemia, unspecified: Secondary | ICD-10-CM | POA: Diagnosis not present

## 2024-09-24 DIAGNOSIS — F4323 Adjustment disorder with mixed anxiety and depressed mood: Secondary | ICD-10-CM | POA: Diagnosis not present

## 2024-10-01 DIAGNOSIS — L570 Actinic keratosis: Secondary | ICD-10-CM | POA: Diagnosis not present

## 2024-10-01 DIAGNOSIS — L821 Other seborrheic keratosis: Secondary | ICD-10-CM | POA: Diagnosis not present

## 2024-10-01 DIAGNOSIS — L819 Disorder of pigmentation, unspecified: Secondary | ICD-10-CM | POA: Diagnosis not present

## 2024-10-01 DIAGNOSIS — D2262 Melanocytic nevi of left upper limb, including shoulder: Secondary | ICD-10-CM | POA: Diagnosis not present

## 2024-10-01 DIAGNOSIS — L814 Other melanin hyperpigmentation: Secondary | ICD-10-CM | POA: Diagnosis not present

## 2024-10-01 DIAGNOSIS — L820 Inflamed seborrheic keratosis: Secondary | ICD-10-CM | POA: Diagnosis not present

## 2024-10-21 ENCOUNTER — Other Ambulatory Visit: Payer: Self-pay | Admitting: Internal Medicine

## 2024-10-21 DIAGNOSIS — Z1231 Encounter for screening mammogram for malignant neoplasm of breast: Secondary | ICD-10-CM

## 2024-10-23 DIAGNOSIS — F4323 Adjustment disorder with mixed anxiety and depressed mood: Secondary | ICD-10-CM | POA: Diagnosis not present

## 2024-10-29 DIAGNOSIS — G4700 Insomnia, unspecified: Secondary | ICD-10-CM | POA: Diagnosis not present

## 2024-10-29 DIAGNOSIS — J310 Chronic rhinitis: Secondary | ICD-10-CM | POA: Diagnosis not present

## 2024-10-29 DIAGNOSIS — G4730 Sleep apnea, unspecified: Secondary | ICD-10-CM | POA: Diagnosis not present

## 2024-11-22 ENCOUNTER — Other Ambulatory Visit: Payer: Self-pay | Admitting: Medical Genetics

## 2024-11-29 ENCOUNTER — Ambulatory Visit
Admission: RE | Admit: 2024-11-29 | Discharge: 2024-11-29 | Disposition: A | Source: Ambulatory Visit | Attending: Internal Medicine | Admitting: Internal Medicine

## 2024-11-29 DIAGNOSIS — Z1231 Encounter for screening mammogram for malignant neoplasm of breast: Secondary | ICD-10-CM

## 2024-12-12 ENCOUNTER — Other Ambulatory Visit

## 2024-12-12 DIAGNOSIS — Z006 Encounter for examination for normal comparison and control in clinical research program: Secondary | ICD-10-CM
